# Patient Record
Sex: Male | Born: 1945 | Race: White | Hispanic: No | Marital: Married | Smoking: Former smoker
Health system: Southern US, Community
[De-identification: ages and names within clinical notes are randomized; demographics above are authoritative.]

## PROBLEM LIST (undated history)

## (undated) DIAGNOSIS — L0390 Cellulitis, unspecified: Secondary | ICD-10-CM

## (undated) DIAGNOSIS — N189 Chronic kidney disease, unspecified: Secondary | ICD-10-CM

## (undated) DIAGNOSIS — M519 Unspecified thoracic, thoracolumbar and lumbosacral intervertebral disc disorder: Secondary | ICD-10-CM

## (undated) DIAGNOSIS — R0902 Hypoxemia: Secondary | ICD-10-CM

## (undated) DIAGNOSIS — R42 Dizziness and giddiness: Secondary | ICD-10-CM

## (undated) DIAGNOSIS — G909 Disorder of the autonomic nervous system, unspecified: Secondary | ICD-10-CM

## (undated) DIAGNOSIS — J189 Pneumonia, unspecified organism: Secondary | ICD-10-CM

## (undated) DIAGNOSIS — R809 Proteinuria, unspecified: Secondary | ICD-10-CM

## (undated) DIAGNOSIS — J8489 Other specified interstitial pulmonary diseases: Secondary | ICD-10-CM

## (undated) DIAGNOSIS — E785 Hyperlipidemia, unspecified: Secondary | ICD-10-CM

## (undated) DIAGNOSIS — L0291 Cutaneous abscess, unspecified: Secondary | ICD-10-CM

## (undated) DIAGNOSIS — J4489 Other specified chronic obstructive pulmonary disease: Secondary | ICD-10-CM

## (undated) DIAGNOSIS — R5381 Other malaise: Secondary | ICD-10-CM

## (undated) DIAGNOSIS — E1340 Other specified diabetes mellitus with diabetic neuropathy, unspecified: Secondary | ICD-10-CM

## (undated) DIAGNOSIS — M25659 Stiffness of unspecified hip, not elsewhere classified: Secondary | ICD-10-CM

## (undated) DIAGNOSIS — K5900 Constipation, unspecified: Secondary | ICD-10-CM

## (undated) DIAGNOSIS — I10 Essential (primary) hypertension: Secondary | ICD-10-CM

## (undated) DIAGNOSIS — J449 Chronic obstructive pulmonary disease, unspecified: Secondary | ICD-10-CM

## (undated) DIAGNOSIS — J439 Emphysema, unspecified: Secondary | ICD-10-CM

## (undated) DIAGNOSIS — I509 Heart failure, unspecified: Secondary | ICD-10-CM

## (undated) DIAGNOSIS — I499 Cardiac arrhythmia, unspecified: Secondary | ICD-10-CM

## (undated) DIAGNOSIS — M25529 Pain in unspecified elbow: Secondary | ICD-10-CM

## (undated) DIAGNOSIS — L84 Corns and callosities: Secondary | ICD-10-CM

## (undated) DIAGNOSIS — M720 Palmar fascial fibromatosis [Dupuytren]: Secondary | ICD-10-CM

## (undated) DIAGNOSIS — R55 Syncope and collapse: Secondary | ICD-10-CM

## (undated) DIAGNOSIS — R109 Unspecified abdominal pain: Secondary | ICD-10-CM

## (undated) DIAGNOSIS — R071 Chest pain on breathing: Secondary | ICD-10-CM

## (undated) DIAGNOSIS — R630 Anorexia: Secondary | ICD-10-CM

## (undated) DIAGNOSIS — E1169 Type 2 diabetes mellitus with other specified complication: Secondary | ICD-10-CM

## (undated) DIAGNOSIS — E1142 Type 2 diabetes mellitus with diabetic polyneuropathy: Secondary | ICD-10-CM

## (undated) DIAGNOSIS — J90 Pleural effusion, not elsewhere classified: Secondary | ICD-10-CM

## (undated) DIAGNOSIS — R05 Cough: Secondary | ICD-10-CM

## (undated) DIAGNOSIS — M79609 Pain in unspecified limb: Secondary | ICD-10-CM

## (undated) DIAGNOSIS — I739 Peripheral vascular disease, unspecified: Secondary | ICD-10-CM

## (undated) DIAGNOSIS — R059 Cough, unspecified: Secondary | ICD-10-CM

## (undated) DIAGNOSIS — G4733 Obstructive sleep apnea (adult) (pediatric): Secondary | ICD-10-CM

## (undated) DIAGNOSIS — R5383 Other fatigue: Secondary | ICD-10-CM

## (undated) DIAGNOSIS — E079 Disorder of thyroid, unspecified: Secondary | ICD-10-CM

## (undated) DIAGNOSIS — R222 Localized swelling, mass and lump, trunk: Secondary | ICD-10-CM

## (undated) DIAGNOSIS — J019 Acute sinusitis, unspecified: Secondary | ICD-10-CM

## (undated) DIAGNOSIS — I2510 Atherosclerotic heart disease of native coronary artery without angina pectoris: Secondary | ICD-10-CM

## (undated) DIAGNOSIS — R12 Heartburn: Secondary | ICD-10-CM

## (undated) DIAGNOSIS — M109 Gout, unspecified: Secondary | ICD-10-CM

## (undated) DIAGNOSIS — I639 Cerebral infarction, unspecified: Secondary | ICD-10-CM

## (undated) DIAGNOSIS — R439 Unspecified disturbances of smell and taste: Secondary | ICD-10-CM

## (undated) HISTORY — DX: Cough: R05

## (undated) HISTORY — PX: CARDIAC CATHETERIZATION: SHX172

## (undated) HISTORY — DX: Acute sinusitis, unspecified: J01.90

## (undated) HISTORY — DX: Other specified chronic obstructive pulmonary disease: J44.89

## (undated) HISTORY — PX: TESTICLE SURGERY: SHX794

## (undated) HISTORY — DX: Cough, unspecified: R05.9

## (undated) HISTORY — DX: Other malaise: R53.81

## (undated) HISTORY — DX: Other specified interstitial pulmonary diseases: J84.89

## (undated) HISTORY — PX: LUNG REMOVAL, PARTIAL: SHX233

## (undated) HISTORY — DX: Cutaneous abscess, unspecified: L02.91

## (undated) HISTORY — DX: Pleural effusion, not elsewhere classified: J90

## (undated) HISTORY — DX: Constipation, unspecified: K59.00

## (undated) HISTORY — DX: Other specified diabetes mellitus with diabetic neuropathy, unspecified: E13.40

## (undated) HISTORY — DX: Pain in unspecified limb: M79.609

## (undated) HISTORY — DX: Other fatigue: R53.83

## (undated) HISTORY — DX: Essential (primary) hypertension: I10

## (undated) HISTORY — DX: Peripheral vascular disease, unspecified: I73.9

## (undated) HISTORY — DX: Obstructive sleep apnea (adult) (pediatric): G47.33

## (undated) HISTORY — DX: Palmar fascial fibromatosis (dupuytren): M72.0

## (undated) HISTORY — DX: Type 2 diabetes mellitus with other specified complication: E11.69

## (undated) HISTORY — DX: Chronic obstructive pulmonary disease, unspecified: J44.9

## (undated) HISTORY — DX: Pneumonia, unspecified organism: J18.9

## (undated) HISTORY — DX: Corns and callosities: L84

## (undated) HISTORY — DX: Unspecified disturbances of smell and taste: R43.9

## (undated) HISTORY — DX: Dizziness and giddiness: R42

## (undated) HISTORY — DX: Atherosclerotic heart disease of native coronary artery without angina pectoris: I25.10

## (undated) HISTORY — DX: Anorexia: R63.0

## (undated) HISTORY — DX: Disorder of the autonomic nervous system, unspecified: G90.9

## (undated) HISTORY — DX: Gout, unspecified: M10.9

## (undated) HISTORY — DX: Localized swelling, mass and lump, trunk: R22.2

## (undated) HISTORY — DX: Proteinuria, unspecified: R80.9

## (undated) HISTORY — DX: Stiffness of unspecified hip, not elsewhere classified: M25.659

## (undated) HISTORY — DX: Unspecified abdominal pain: R10.9

## (undated) HISTORY — DX: Pain in unspecified elbow: M25.529

## (undated) HISTORY — DX: Emphysema, unspecified: J43.9

## (undated) HISTORY — DX: Unspecified thoracic, thoracolumbar and lumbosacral intervertebral disc disorder: M51.9

## (undated) HISTORY — DX: Type 2 diabetes mellitus with diabetic polyneuropathy: E11.42

## (undated) HISTORY — DX: Heartburn: R12

## (undated) HISTORY — DX: Chest pain on breathing: R07.1

## (undated) HISTORY — DX: Cellulitis, unspecified: L03.90

## (undated) HISTORY — DX: Hypoxemia: R09.02

## (undated) HISTORY — DX: Hyperlipidemia, unspecified: E78.5

---

## 1999-12-14 HISTORY — PX: LUMBAR DISC SURGERY: SHX700

## 2002-06-29 ENCOUNTER — Inpatient Hospital Stay (HOSPITAL_COMMUNITY): Admission: EM | Admit: 2002-06-29 | Discharge: 2002-07-04 | Payer: Self-pay | Admitting: Emergency Medicine

## 2002-06-29 ENCOUNTER — Encounter: Payer: Self-pay | Admitting: Emergency Medicine

## 2002-07-01 ENCOUNTER — Encounter: Payer: Self-pay | Admitting: Internal Medicine

## 2003-10-27 ENCOUNTER — Inpatient Hospital Stay (HOSPITAL_COMMUNITY): Admission: EM | Admit: 2003-10-27 | Discharge: 2003-10-30 | Payer: Self-pay | Admitting: Emergency Medicine

## 2008-12-13 HISTORY — PX: ARM DEBRIDEMENT: SHX890

## 2010-12-13 HISTORY — PX: VASCULAR SURGERY: SHX849

## 2011-08-31 ENCOUNTER — Ambulatory Visit (INDEPENDENT_AMBULATORY_CARE_PROVIDER_SITE_OTHER): Payer: 59 | Admitting: Thoracic Surgery (Cardiothoracic Vascular Surgery)

## 2011-08-31 ENCOUNTER — Encounter: Payer: Self-pay | Admitting: Thoracic Surgery (Cardiothoracic Vascular Surgery)

## 2011-08-31 DIAGNOSIS — R911 Solitary pulmonary nodule: Secondary | ICD-10-CM

## 2011-08-31 DIAGNOSIS — J984 Other disorders of lung: Secondary | ICD-10-CM

## 2011-08-31 DIAGNOSIS — J449 Chronic obstructive pulmonary disease, unspecified: Secondary | ICD-10-CM

## 2011-08-31 NOTE — Progress Notes (Signed)
PCP is No primary provider on file. Referring Provider is Kenney Houseman, MD  Chief Complaint  Patient presents with  . Lung Lesion    HPI: 65 yo WM with COPD. Diagnosed with pneumonia of left lower lobe in July of 2012 after presenting with hemoptysis, treated with antibiotics. His symptoms resolved, but the consolidation never completely resolved on Xray.  He underwent bronchoscopy on 08/04/11. Path showed chronic inflammation and some atypical cells suspicious for adenocarcinoma, but not diagnostic. He then had a CT guided needle biopsy which showed inflammation, no malignancy seen. He then had a PET which showed increased metabolic uptake in the lesion.  He has severe COPD by PFTs which were done a few weeks later, but does say his breathing has improved since that time. He has not had any further hemoptysis. Able to work in yard and on an old car. Completed 6 minute walk test without dyspnea or significant desat.   Past Medical History  Diagnosis Date  . Abdominal pain, unspecified site   . Acute gouty arthropathy   . Acute sinusitis, unspecified   . Painful respiration   . Anorexia   . Essential hypertension, benign     Patient is currently not taking his blood pressure medications as prescribed.  . Corns and callosities   . Cellulitis and abscess of unspecified site   . Chronic airway obstruction, not elsewhere classified     2x30 quit 90 ? restriction/2x25 quit 90  . Pneumonia, organism unspecified     left lower lobe  . Unspecified constipation   . Coronary atherosclerosis of unspecified type of vessel, native or graft   . Cough   . Hemoptysis   . Peripheral autonomic neuropathy in disorders classified elsewhere   . Polyneuropathy in diabetes   . Disturbances of sensation of smell and taste   . Dizziness and giddiness   . Contracture of palmar fascia   . Unspecified essential hypertension     Hypertension Inadequately controlled  . Other malaise and fatigue   . Pain in  limb     foot pain(soft tissue)  . Heartburn   . Hyperlipidemia   . Hypoxemia   . Pain in joint, upper arm     localized in the elbow  . Stiffness of joint, not elsewhere classified, pelvic region and thigh     hip  . Unspecified pleural effusion     left-sided  . Other and unspecified disc disorder of lumbar region   . Swelling, mass, or lump in chest     LLL superior segment peripheral ?cavitations, pl changes  . Proteinuria   . Obstructive sleep apnea of adult   . Neuropathy due to secondary diabetes   . PAD (peripheral artery disease)   . Diabetes mellitus   . Diabetes mellitus associated with receptor abnormality     No past surgical history on file.  Family History  Problem Relation Age of Onset  . Diabetes Mother   . Hypertension Mother   . COPD Father     Social History History  Substance Use Topics  . Smoking status: Former Smoker -- 0.0 packs/day    Quit date: 12/13/1988  . Smokeless tobacco: Not on file  . Alcohol Use:     Current Outpatient Prescriptions  Medication Sig Dispense Refill  . albuterol (PROVENTIL) (2.5 MG/3ML) 0.083% nebulizer solution Take 2.5 mg by nebulization. Use 1 Unit Dose VIA Nebulizer 4 times a Day as Needed       . AMITRIPTYLINE HCL  PO 50 mg, Take 3 tablets bedtime       . budesonide-formoterol (SYMBICORT) 160-4.5 MCG/ACT inhaler Inhale 2 puffs into the lungs 2 (two) times daily.        Marland Kitchen gabapentin (NEURONTIN) 600 MG tablet Take 1-2 Tab PO BID       . glucose blood (ACCU-CHEK ADVANTAGE TEST) test strip Check BS prior to each meal and bedtime       . guaiFENesin-codeine (CHERATUSSIN AC) 100-10 MG/5ML syrup Take 1 Teaspoonful every 4-6 hours as needed       . hydrochlorothiazide (HYDRODIURIL) 25 MG tablet Take 25 mg by mouth daily.        . Insulin Aspart (NOVOLOG [place]) Inject into the skin. Use as directed       . insulin glargine (LANTUS) 100 UNIT/ML injection Inject 40 Units at bedtime       . Insulin Syringe-Needle U-100 (B-D INS  SYRINGE 0.5CC/31GX5/16) 31G X 5/16" 0.5 ML MISC Use 4 shots per day       . losartan (COZAAR) 100 MG tablet Take 100 mg by mouth daily.        Marland Kitchen losartan-hydrochlorothiazide (HYZAAR) 100-25 MG per tablet       . pravastatin (PRAVACHOL) 80 MG tablet Take 80 mg by mouth at bedtime.          Allergies  Allergen Reactions  . Prednisone Other (See Comments)    hyperglycemia    Review of Systems: Denies F, C, S, No CP. + SOB with exertion, occasional; wheezing, o2 QHS for OSA, finger contractures, DM well controlled on lantus + novolog SSI, neuropathy, back pain, cough. All other systems -  There were no vitals taken for this visit. Physical Exam: 65 yo WM in NAD, obese Neuro: A & o x 3, no focal motor deficits HEENT: glasses Neck: No bruits or adenopathy Chest: Diminished BS bilat, + EE wheezing bilat Cardiac: RRR, no murmur Abd: obese, soft nontender Ext: contractures 4th and 5th digits both hands, no edema, mild clubbing  Diagnostic Tests: PET reviewed. Consolidated superior segment LLL, SUV 2.8, central focus 1.5 cm, SUV 4.6. Uptake in both parotid glands.  PFTS:FEV1 1.03(30%), FVC 1.88(44%), DLCO 48% pred(2008)  Impression: 65 yo Wm with history of heavy tobacco abuse and severe COPD recently had severe pneumonia of superior segment of LLL. Consolidation has not completely resolved. It is hypermetabolic by PET. The question of an underlying lung cancer has been raised and some atypical cells were seen on bronchocopic biopsy. This could simply be BOOP or there could be an underlying cancer. The only way to know for sure is to do an excisional biopsy, which in all likelihood in his case would require a superior segmentectomy. There is not a discrete nodule amenable to wedge resection. He is a marginal candidate by PFTs, but his exercise tolerance and 6 min walk test indicate he can probably tolerate a segmental resection. I don't believe he's a candidate for a lobectomy. The other  options are to repeat bronch or to follow the area with serial CTs. He is not interested in either of those approaches and strongly desires surgical resection.  I discussed with the patient and his wife the indications, risks, benefits and alternatives. They understand the operation would be a Left VATS, possible thoracotomy, superior segmentectomy. They understand he is high risk. They understand the risks include but are not limited to death, MI, DVT, PE, bleeding, possible need for transfusion, infection, respiratory failure, other organ system dysfunction, prolonged  air leak.  Plan: He wishes to proceed  With L VATS, wedge resection vs. LLL superior segmentectomy for definitive diagnosis.   Needs preoperative cardiology consult for clearance given multiple risk factors and DM with limited physical activity. Will arrange appointment  Follow up with Dr. Laneta Simmers after seen by Cardiology.

## 2011-09-13 ENCOUNTER — Encounter: Payer: Self-pay | Admitting: Surgery

## 2011-09-13 ENCOUNTER — Ambulatory Visit (INDEPENDENT_AMBULATORY_CARE_PROVIDER_SITE_OTHER): Payer: 59 | Admitting: Surgery

## 2011-09-13 DIAGNOSIS — R911 Solitary pulmonary nodule: Secondary | ICD-10-CM | POA: Insufficient documentation

## 2011-09-13 DIAGNOSIS — J984 Other disorders of lung: Secondary | ICD-10-CM

## 2011-09-13 NOTE — Progress Notes (Signed)
HPI:  Anthony Petersen returns today following preoperative cardiology evaluation for possible lung resection. Dr. Dot Been did not feel that further cardiology evaluation was needed. He thought that his overall cardiac status was stable and he was an acceptable risk for the planned procedure. The patient said that his breathing has been stable. He continues to use oxygen by nasal cannula at night and is currently on about 3 L per minute. He says that he felt fairly good early in the summer before his shortness of breath and hemoptysis started. He was able to get out in the yard and cut his grass. His breathing has improved with treatment of possible pneumonia but he is not back to baseline.  Current Outpatient Prescriptions  Medication Sig Dispense Refill  . albuterol (PROVENTIL) (2.5 MG/3ML) 0.083% nebulizer solution Take 2.5 mg by nebulization. Use 1 Unit Dose VIA Nebulizer 4 times a Day as Needed       . AMITRIPTYLINE HCL PO 50 mg, Take 3 tablets bedtime       . budesonide-formoterol (SYMBICORT) 160-4.5 MCG/ACT inhaler Inhale 2 puffs into the lungs 2 (two) times daily.        Marland Kitchen gabapentin (NEURONTIN) 600 MG tablet Take 1-2 Tab PO BID       . glucose blood (ACCU-CHEK ADVANTAGE TEST) test strip Check BS prior to each meal and bedtime       . guaiFENesin-codeine (CHERATUSSIN AC) 100-10 MG/5ML syrup Take 1 Teaspoonful every 4-6 hours as needed       . hydrochlorothiazide (HYDRODIURIL) 25 MG tablet Take 25 mg by mouth daily.        . Insulin Aspart (NOVOLOG [place]) Inject into the skin. Use as directed       . insulin glargine (LANTUS) 100 UNIT/ML injection Inject 40 Units at bedtime       . Insulin Syringe-Needle U-100 (B-D INS SYRINGE 0.5CC/31GX5/16) 31G X 5/16" 0.5 ML MISC Use 4 shots per day       . losartan (COZAAR) 100 MG tablet Take 100 mg by mouth daily.        Marland Kitchen losartan-hydrochlorothiazide (HYZAAR) 100-25 MG per tablet       . pravastatin (PRAVACHOL) 80 MG tablet Take 80 mg by mouth at bedtime.            Physical Exam:  He looks well. Lung exam is clear. Cardiac exam shows a regular rate and rhythm with normal heart sounds. There is no murmur, rub, or gallop. There is no cervical or supraclavicular adenopathy. Abdominal exam shows active bowel sounds. There are no palpable masses or organomegaly.    Impression:  Anthony Petersen has an ill-defined, infiltrative process involving the left lower lobe of the lung. When he initially presented this appeared to be more of a well-defined round mass. Since his initial presentation the more well-defined round mass has resolved but there is still this ill-defined process involving the superior segment of the left lower lobe. This does extend down along the left lower lobe bronchus and could extend into the basilar segments. His initial transbronchial lung biopsy from 07/27/2011 showed rare atypical cells suspicious for carcinoma in the brushings and biopsies from the left lower lobe. It was noted by the pathologist that this was suspicious for adenocarcinoma although they could not completely exclude reactive pulmonary cells secondary to the tremendous inflammation and fibrosis present. A subsequent needle biopsy of this area showed organizing pneumonia with interstitial chronic inflammation and no evidence of malignancy. His PET scan from 08/20/2011  did show consolidation of the superior segment of left lower lobe posterior medially against the pleura. Most of this consolidated lung has increased low level uptake with a maximum SUV of 2.8. There is a focus within this measuring 1.5 cm that had a maximum SUV of 4.6. He also had mildly prominent uptake in both parotid glands that was nonspecific. Despite all this workup it is still not clear whether this process in the left lower lobe of the lung is all inflammatory or whether there is a neoplasm located within it. Dr. Dorris Fetch and I both discussed the options with the patient and his wife including  continuing to follow this with periodic CT scans and surgical resection of the lesion to allow a more precise pathologic diagnosis. I don't think further biopsies by bronchoscopy will give [place] any better information. This is a complicated decision because he has severe COPD with an FEV1 of 1.0 and a severe reduction in his diffusion capacity. I think he would probably tolerate a superior segmentectomy but a left lower lobectomy may leave him with significant pulmonary disability. Unfortunately if there is cancer present we may not be able to completely remove it without performing a lobectomy since this lesion does appear to extend down along the left lower lobe bronchus and may involve the basilar segments. I would plan to perform a superior segmentectomy initially for pathologic examination and assessment of the frozen section margin. If there is cancer present and it involves the surgical margin then it would probably be best to perform a completion left lower lobectomy to remove all the cancer. If we only remove the superior segment and leave cancer behind in the basilar segments I don't think the patient will be any better off than he is now. With his severe COPD he would probably not be a candidate for radiation therapy postoperatively. I discussed all this with the patient and his wife. I discussed the alternatives, benefits, and risks including but not limited to bleeding, blood transfusion, infection, prolonged air leak from the lung, bronchopleural fistula, prolonged mechanical ventilation, and ongoing pulmonary disability. I also discussed the risk of death due to complications from surgery. He understands all this and would like to proceed with surgery.  Plan:  We will schedule him for bronchoscopy, left VATS, and possible thoracotomy for resection of the left lower lobe lesion on Wednesday, 09/22/2011.

## 2011-09-22 DIAGNOSIS — D381 Neoplasm of uncertain behavior of trachea, bronchus and lung: Secondary | ICD-10-CM

## 2011-09-23 DIAGNOSIS — E1165 Type 2 diabetes mellitus with hyperglycemia: Secondary | ICD-10-CM

## 2011-10-27 ENCOUNTER — Ambulatory Visit (INDEPENDENT_AMBULATORY_CARE_PROVIDER_SITE_OTHER): Payer: 59 | Admitting: Physician Assistant

## 2011-10-27 DIAGNOSIS — Z9889 Other specified postprocedural states: Secondary | ICD-10-CM

## 2011-10-27 DIAGNOSIS — D381 Neoplasm of uncertain behavior of trachea, bronchus and lung: Secondary | ICD-10-CM

## 2011-10-27 NOTE — Progress Notes (Signed)
Mr. Anthony Petersen return to the office today for followup after left video-assisted thoracoscopy followed by left thoracotomy with left lower lobectomy by Dr. Lavinia Sharps on 09/22/2011. Mr. Anthony Petersen presented with finding of a consolidated mass in the superior segment of the left upper lobe that was suspicious for malignancy. Please see Dr. Edwin Cap consultation note for details. Left lower lobe lobectomy was carried out without complication. Mr. Anthony Petersen had an uncomplicated postoperative course and since his discharge home he is continuing to make satisfactory progress. He continues to have some shortness of breath which he relates to his long-standing history of severe COPD. He was seen by his infectious disease doctor yesterday in according to Mr. Anthony Petersen the antibiotics were discontinued at that visit. Mr. Anthony Petersen and his wife also related to me that they had been referred to a pulmonologist at [facility] for additional evaluation.  On exam the heart is in a regular rate and rhythm. Heart rate is 60 blood pressure 148/98. Breath sounds are clear to auscultation. Chest x-ray obtained today shows stable chronic changes in the left base. The right lung remains clear. Sternotomy incision is well healed.  Medications are reviewed. Mr. Anthony Petersen had not been taking his pravastatin since his hospitalization/than to resume at aged. His antihypertensive medicine was also apparently change during hospitalization to lisinopril and chlorthalidone which is resulting in better control according to the patient. No medication changes are made except for the advised to resume the pravastatin.

## 2011-12-17 LAB — PULMONARY FUNCTION TEST

## 2012-01-31 ENCOUNTER — Encounter (HOSPITAL_COMMUNITY)
Admission: RE | Admit: 2012-01-31 | Discharge: 2012-01-31 | Disposition: A | Discharge status: Home / Self Care | Payer: 59 | Source: Ambulatory Visit | Attending: Internal Medicine | Admitting: Internal Medicine

## 2012-01-31 ENCOUNTER — Encounter (HOSPITAL_COMMUNITY): Payer: Self-pay

## 2012-01-31 DIAGNOSIS — J449 Chronic obstructive pulmonary disease, unspecified: Secondary | ICD-10-CM | POA: Insufficient documentation

## 2012-01-31 DIAGNOSIS — Z5189 Encounter for other specified aftercare: Secondary | ICD-10-CM | POA: Insufficient documentation

## 2012-01-31 DIAGNOSIS — J4489 Other specified chronic obstructive pulmonary disease: Secondary | ICD-10-CM | POA: Insufficient documentation

## 2012-01-31 HISTORY — DX: Cardiac arrhythmia, unspecified: I49.9

## 2012-01-31 HISTORY — DX: Chronic kidney disease, unspecified: N18.9

## 2012-01-31 NOTE — Progress Notes (Signed)
Pt here for orientation to Pulmonary Rehab.  Walk test done, patient states he has had circulation issues in his legs with stenting in R leg.  Distal pulses  1/2 bilateral.  Feet warm.  Color good.  We will observe for any increased discomfort or changes as he exercises. Demonstration and practice of PLB using pulse oximeter.  Patient able to return demonstration satisfactorily. Safety and hand hygiene in the exercise area reviewed with patient.  Patient voices understanding.  Breath sounds diminished L base.  He will begin exercise on 02-01-2012

## 2012-02-01 ENCOUNTER — Encounter (HOSPITAL_COMMUNITY): Payer: 59

## 2012-02-01 ENCOUNTER — Encounter (HOSPITAL_COMMUNITY)
Admission: RE | Admit: 2012-02-01 | Discharge: 2012-02-01 | Disposition: A | Discharge status: Home / Self Care | Payer: 59 | Source: Ambulatory Visit | Attending: Internal Medicine | Admitting: Internal Medicine

## 2012-02-01 LAB — GLUCOSE, CAPILLARY
Glucose-Capillary: 72 mg/dL (ref 70–99)
Glucose-Capillary: 101 mg/dL — ABNORMAL HIGH (ref 70–99)

## 2012-02-01 NOTE — Progress Notes (Signed)
First day of exercise in Pulmonary Rehab.  Patient oriented to equipment use and safety, RPE and Dyspnea Scale, Rest breaks. Demonstration and practice of PLB  On each exercise station.  Tolerated well, vital signs stable. O2 satuartions were stable in 90's.  Tolerated well

## 2012-02-03 ENCOUNTER — Encounter (HOSPITAL_COMMUNITY): Payer: 59

## 2012-02-03 ENCOUNTER — Encounter (HOSPITAL_COMMUNITY)
Admission: RE | Admit: 2012-02-03 | Discharge: 2012-02-03 | Disposition: A | Discharge status: Home / Self Care | Payer: 59 | Source: Ambulatory Visit | Attending: Internal Medicine | Admitting: Internal Medicine

## 2012-02-08 ENCOUNTER — Encounter (HOSPITAL_COMMUNITY): Payer: 59

## 2012-02-08 ENCOUNTER — Encounter (HOSPITAL_COMMUNITY)
Admission: RE | Admit: 2012-02-08 | Discharge: 2012-02-08 | Disposition: A | Discharge status: Home / Self Care | Payer: 59 | Source: Ambulatory Visit | Attending: Internal Medicine | Admitting: Internal Medicine

## 2012-02-10 ENCOUNTER — Encounter (HOSPITAL_COMMUNITY)
Admission: RE | Admit: 2012-02-10 | Discharge: 2012-02-10 | Disposition: A | Discharge status: Home / Self Care | Payer: 59 | Source: Ambulatory Visit | Attending: Internal Medicine | Admitting: Internal Medicine

## 2012-02-10 ENCOUNTER — Encounter (HOSPITAL_COMMUNITY): Payer: 59

## 2012-02-10 NOTE — Progress Notes (Signed)
Pulmonary Rehab Nutrition Screen  Anthony Petersen 66 y.o. male             Ht: 71" Ht Readings from Last 1 Encounters:  No data found for Ht    Wt:   220.7 lb (100.3 kg) Wt Readings from Last 3 Encounters:  No data found for Wt    BMI: 30.8  27.7%body fat                       Rate Your Plate Score: 36  Please answer the following questions:             YES  NO    Do you live in a nursing home?  X   Do you eat out more than 3 times per week?    X If yes, how many times per week do you eat out?   Do you have food allergies?   X If yes, what are you allergic to?  Have you gained or lost more than 10 lbs without trying?              X  If yes, how much weight have you  lost or gained? 15 lbs over 3 mo   Do you want to lose weight?    X X If yes, what is a goal weight or amount of weight you would like to lose? 20 lbs  Do you eat alone most of the time?  X    Do you eat less than 2 meals/day?  X If yes, how many meals do you eat?  Do you use canned and convenience food? X    Do you use a salt shaker?  X   Do you drink more than 3 alcoholic drinks/day?  X If yes, how many drinks per day?  Are you having trouble with constipation? *  X If yes, what are you doing to help relieve constipation?  Do you have financial difficulties with buying food? *  X   Do you usually need help with grocery shopping or with cooking? *  X   Do you have a poor appetite? *                                       X   Do you have trouble chewing/ swallowing? *   X   Do you take vitamin and mineral or herbal supplements? * X  If yes, what kind of supplements do you currently take? MVI    Past Medical History  Diagnosis Date  . Abdominal pain, unspecified site   . Acute gouty arthropathy   . Acute sinusitis, unspecified   . Painful respiration   . Anorexia   . Essential hypertension, benign     Patient is currently not taking his blood pressure medications as prescribed.  . Corns and callosities   .  Cellulitis and abscess of unspecified site   . Chronic airway obstruction, not elsewhere classified     2x30 quit 90 ? restriction/2x25 quit 90  . Pneumonia, organism unspecified     left lower lobe  . Unspecified constipation   . Cough   . Hemoptysis   . Peripheral autonomic neuropathy in disorders classified elsewhere   . Polyneuropathy in diabetes   . Disturbances of sensation of smell and taste   . Dizziness and giddiness   . Contracture  of palmar fascia   . Unspecified essential hypertension     Hypertension Inadequately controlled  . Other malaise and fatigue   . Pain in limb     foot pain(soft tissue)  . Heartburn   . Hyperlipidemia   . Hypoxemia   . Pain in joint, upper arm     localized in the elbow  . Stiffness of joint, not elsewhere classified, pelvic region and thigh     hip  . Unspecified pleural effusion     left-sided  . Other and unspecified disc disorder of lumbar region   . Swelling, mass, or lump in chest     LLL superior segment peripheral ?cavitations, pl changes  . Proteinuria   . Obstructive sleep apnea of adult   . Neuropathy due to secondary diabetes   . PAD (peripheral artery disease)   . Diabetes mellitus   . Diabetes mellitus associated with receptor abnormality   . Arrhythmia     irregular heart beat  . Coronary atherosclerosis of unspecified type of vessel, native or graft   . Chronic kidney disease     early stages of renal failure  Meds: Hydrochlorothiazide, Novolog, Lantus, MVI Labs Lipids Lipid Panel  No results found for this basename: chol, trig, hdl, cholhdl, vldl, ldlcalc   No results found for this basename: HGBA1C   Nutrition Note Spoke with pt briefly.  Pt reports his CBG's falling 70 points with exercise.  Pt states he ate a bowl of Cheerios and 1/2 banana before exercise today.  Healthier pre-exercise breakfast choices discussed (e.g. Peanut butter on whole wheat toast, changing to a higher fiber cereal (shredded wheat,  wheat chex, etc.), or making an Egg beater sandwich on a whole grain English muffin.  Pt states he has been to many DM education classes.  Per pt, "I know what I should do, I just don't do it." This Clinical research associate ? Pt re: barriers to changing pt diet to become healthier.  Pt unable to name one barrier he sees prohibiting him from changing his diet. Continue client-centered nutrition education by RD as part of interdisciplinary care.  Monitor and evaluate progress toward nutrition goal with team.   Estimated Nutrition Needs for wt loss: 1700-2200 kcal, 80-100 gm protein, and less than 1500 mg sodium daily.  Nutrition Risk Level: High

## 2012-02-15 ENCOUNTER — Encounter (HOSPITAL_COMMUNITY): Payer: 59

## 2012-02-15 ENCOUNTER — Encounter (HOSPITAL_COMMUNITY)
Admission: RE | Admit: 2012-02-15 | Discharge: 2012-02-15 | Disposition: A | Discharge status: Home / Self Care | Payer: 59 | Source: Ambulatory Visit | Attending: Internal Medicine | Admitting: Internal Medicine

## 2012-02-15 DIAGNOSIS — J449 Chronic obstructive pulmonary disease, unspecified: Secondary | ICD-10-CM | POA: Insufficient documentation

## 2012-02-15 DIAGNOSIS — Z5189 Encounter for other specified aftercare: Secondary | ICD-10-CM | POA: Insufficient documentation

## 2012-02-15 DIAGNOSIS — J4489 Other specified chronic obstructive pulmonary disease: Secondary | ICD-10-CM | POA: Insufficient documentation

## 2012-02-17 ENCOUNTER — Encounter (HOSPITAL_COMMUNITY)
Admission: RE | Admit: 2012-02-17 | Discharge: 2012-02-17 | Disposition: A | Discharge status: Home / Self Care | Payer: 59 | Source: Ambulatory Visit | Attending: Internal Medicine | Admitting: Internal Medicine

## 2012-02-17 ENCOUNTER — Encounter (HOSPITAL_COMMUNITY): Payer: 59

## 2012-02-22 ENCOUNTER — Encounter (HOSPITAL_COMMUNITY)
Admission: RE | Admit: 2012-02-22 | Discharge: 2012-02-22 | Disposition: A | Discharge status: Home / Self Care | Payer: 59 | Source: Ambulatory Visit | Attending: Internal Medicine | Admitting: Internal Medicine

## 2012-02-22 ENCOUNTER — Encounter (HOSPITAL_COMMUNITY): Payer: 59

## 2012-02-22 NOTE — Progress Notes (Signed)
Anthony Petersen 66 y.o. male Nutrition Note Spoke with pt. Pt is obese.  Pt eats 3 meals a day; most prepared at home.  There are many ways the pt can make his eating habits healthier.  Pt's Rate Your Plate results reviewed with pt.  Pt expressed understanding.  Pt does not avoid salty food; uses canned/ convenience food.  Pt does not add salt or salt-containing seasonings to food.  The role of sodium in lung disease reviewed with pt.  Pt is diabetic.  Will follow-up with pt re: diabetes management. Nutrition Diagnosis   Excessive sodium intake related to over consumption of processed food as evidenced by frequent consumption of convenience food/ canned vegetables.   Food-and nutrition-related knowledge deficit related to lack of exposure to information as related to diagnosis of pulmonary disease   Obesity related to excessive energy intake as evidenced by a BMI of 30.8 Nutrition Rx/Est. Daily Nutrition Needs for: ? wt loss 1700-2200 Kcal  80-100 gm protein   1500 mg or less sodium     250 gm CHO Nutrition Intervention   Benefits of adopting healthy eating habits discussed when pt's Rate Your Plate reviewed.   Pt to attend the Nutrition and Lung Disease class   Continual client-centered nutrition education by RD, as part of interdisciplinary care. Goal(s) 1. Pt to identify and limit food sources of sodium. 2. Identify food quantities necessary to achieve wt loss of  -2# per week to a goal wt of 89.1-97.3 kg (196-214 lb) at graduation from pulmonary rehab. 3. Describe the benefit of including fruits, vegetables, whole grains, and low-fat dairy products in a healthy meal plan. 4. Use pre-/post meal and/or exercise CBG's and A1c to determine whether adjustments in food/meal planning will be beneficial or if any meds need to be combined with nutrition therapy. Monitor and Evaluate progress toward nutrition goal with team.

## 2012-02-22 NOTE — Progress Notes (Signed)
Anthony Petersen has recently had some difficulty with drop in CBG after exercise.  Today entry CBG was 204,  120 after 2 stations of exercise at which time he was given a snack .  After completion of exercise and cool down his CBG was 73.  He was given juice and  After 15 minutes he was back to 109.  We reviewed his morning meal and he will try to include more protein on days of exercise.

## 2012-02-24 ENCOUNTER — Encounter (HOSPITAL_COMMUNITY)
Admission: RE | Admit: 2012-02-24 | Discharge: 2012-02-24 | Disposition: A | Discharge status: Home / Self Care | Payer: 59 | Source: Ambulatory Visit | Attending: Internal Medicine | Admitting: Internal Medicine

## 2012-02-24 ENCOUNTER — Encounter (HOSPITAL_COMMUNITY): Payer: 59

## 2012-02-24 NOTE — Progress Notes (Signed)
Anthony Petersen 66 y.o. male Nutrition Note Pt is diabetic.  Pt checks CBG's before meals. Pt states his last A1c was "in the low 7's." Pt unable to recall recommended CBG ranges before meals and 1-2 hours after meals.  Recommended CBG ranges reviewed with pt.  Pt able to recall hypoglycemia and hyperglycemia treatment protocols.  Treatment of hypoglycemia and hyperglycemia reviewed with pt.  Pt expressed understanding.  Pt reports he has been previously educated re: diabetic diet. Per discussion with pt, pt is proactive re: DM management.  Pt controls CBG's with "insulin." Pt asked several questions re: reasoning for dietary changes recommended during previous consultation.  Reasoning discussed.  Pt c/o not liking a lot of the healthier food choices.  This Clinical research associate encouraged pt to make dietary changes intentional and slow. Pt expressed understanding. Nutrition Diagnosis   Excessive sodium intake related to over consumption of processed food as evidenced by frequent consumption of convenience food/ canned vegetables.   Food-and nutrition-related knowledge deficit related to lack of exposure to information as related to diagnosis of pulmonary disease   Obesity related to excessive energy intake as evidenced by a BMI of 30.8 Nutrition Rx/Est. Daily Nutrition Needs for: ? wt loss 1700-2200 Kcal  80-100 gm protein   1500 mg or less sodium     250 gm CHO Nutrition Intervention   Pt's individual nutrition plan and goals reviewed with pt.   Pt to attend the Nutrition and Lung Disease class   Continual client-centered nutrition education by RD, as part of interdisciplinary care. Goal(s) 1. Pt to identify and limit food sources of sodium. 2. Identify food quantities necessary to achieve wt loss of  -2# per week to a goal wt of 89.1-97.3 kg (196-214 lb) at graduation from pulmonary rehab. 3. Describe the benefit of including fruits, vegetables, whole grains, and low-fat dairy products in a healthy meal  plan. 4. Use pre-/post meal and/or exercise CBG's and A1c to determine whether adjustments in food/meal planning will be beneficial or if any meds need to be combined with nutrition therapy. Monitor and Evaluate progress toward nutrition goal with team.   Nutrition Risk level: Moderate

## 2012-02-29 ENCOUNTER — Encounter (HOSPITAL_COMMUNITY): Payer: 59

## 2012-02-29 ENCOUNTER — Encounter (HOSPITAL_COMMUNITY)
Admission: RE | Admit: 2012-02-29 | Discharge: 2012-02-29 | Disposition: A | Discharge status: Home / Self Care | Payer: 59 | Source: Ambulatory Visit | Attending: Internal Medicine | Admitting: Internal Medicine

## 2012-03-02 ENCOUNTER — Encounter (HOSPITAL_COMMUNITY): Payer: 59

## 2012-03-02 ENCOUNTER — Encounter (HOSPITAL_COMMUNITY)
Admission: RE | Admit: 2012-03-02 | Discharge: 2012-03-02 | Disposition: A | Discharge status: Home / Self Care | Payer: 59 | Source: Ambulatory Visit | Attending: Internal Medicine | Admitting: Internal Medicine

## 2012-03-07 ENCOUNTER — Encounter (HOSPITAL_COMMUNITY): Payer: 59

## 2012-03-07 ENCOUNTER — Encounter (HOSPITAL_COMMUNITY)
Admission: RE | Admit: 2012-03-07 | Discharge: 2012-03-07 | Disposition: A | Discharge status: Home / Self Care | Payer: 59 | Source: Ambulatory Visit | Attending: Internal Medicine | Admitting: Internal Medicine

## 2012-03-09 ENCOUNTER — Encounter (HOSPITAL_COMMUNITY): Payer: 59

## 2012-03-09 ENCOUNTER — Encounter (HOSPITAL_COMMUNITY)
Admission: RE | Admit: 2012-03-09 | Discharge: 2012-03-09 | Disposition: A | Discharge status: Home / Self Care | Payer: 59 | Source: Ambulatory Visit | Attending: Internal Medicine | Admitting: Internal Medicine

## 2012-03-09 NOTE — Progress Notes (Signed)
Completed home exercise with patient. Reviewed exercise progression, routine, exercising at a comfortable pace, RPE/Dyspnea scales, how important it is to own a pulse oximeter and how to use one, weather conditions, warning signs and symptoms with exercise, and CP/NTG. We discussed when to call MD. Patient voices understanding. Patient has a goal to breathe better while doing ADLs and spend more quality time with grandbabies. Will continue to encourage and support.

## 2012-03-14 ENCOUNTER — Encounter (HOSPITAL_COMMUNITY): Payer: 59

## 2012-03-14 ENCOUNTER — Encounter (HOSPITAL_COMMUNITY)
Admission: RE | Admit: 2012-03-14 | Discharge: 2012-03-14 | Disposition: A | Discharge status: Home / Self Care | Payer: 59 | Source: Ambulatory Visit | Attending: Internal Medicine | Admitting: Internal Medicine

## 2012-03-14 DIAGNOSIS — J4489 Other specified chronic obstructive pulmonary disease: Secondary | ICD-10-CM | POA: Insufficient documentation

## 2012-03-14 DIAGNOSIS — J449 Chronic obstructive pulmonary disease, unspecified: Secondary | ICD-10-CM | POA: Insufficient documentation

## 2012-03-14 DIAGNOSIS — Z5189 Encounter for other specified aftercare: Secondary | ICD-10-CM | POA: Insufficient documentation

## 2012-03-16 ENCOUNTER — Encounter (HOSPITAL_COMMUNITY): Admission: RE | Admit: 2012-03-16 | Payer: 59 | Source: Ambulatory Visit

## 2012-03-16 ENCOUNTER — Encounter (HOSPITAL_COMMUNITY): Payer: 59

## 2012-03-21 ENCOUNTER — Encounter (HOSPITAL_COMMUNITY): Payer: 59

## 2012-03-21 ENCOUNTER — Encounter (HOSPITAL_COMMUNITY)
Admission: RE | Admit: 2012-03-21 | Discharge: 2012-03-21 | Disposition: A | Discharge status: Home / Self Care | Payer: 59 | Source: Ambulatory Visit | Attending: Internal Medicine | Admitting: Internal Medicine

## 2012-03-23 ENCOUNTER — Encounter (HOSPITAL_COMMUNITY)
Admission: RE | Admit: 2012-03-23 | Discharge: 2012-03-23 | Disposition: A | Discharge status: Home / Self Care | Payer: 59 | Source: Ambulatory Visit | Attending: Internal Medicine | Admitting: Internal Medicine

## 2012-03-23 ENCOUNTER — Encounter (HOSPITAL_COMMUNITY): Payer: 59

## 2012-03-28 ENCOUNTER — Encounter (HOSPITAL_COMMUNITY)
Admission: RE | Admit: 2012-03-28 | Discharge: 2012-03-28 | Disposition: A | Discharge status: Home / Self Care | Payer: 59 | Source: Ambulatory Visit | Attending: Internal Medicine | Admitting: Internal Medicine

## 2012-03-28 ENCOUNTER — Encounter (HOSPITAL_COMMUNITY): Payer: 59

## 2012-03-30 ENCOUNTER — Encounter (HOSPITAL_COMMUNITY)
Admission: RE | Admit: 2012-03-30 | Discharge: 2012-03-30 | Disposition: A | Discharge status: Home / Self Care | Payer: 59 | Source: Ambulatory Visit | Attending: Internal Medicine | Admitting: Internal Medicine

## 2012-03-30 ENCOUNTER — Encounter (HOSPITAL_COMMUNITY): Payer: 59

## 2012-04-04 ENCOUNTER — Encounter (HOSPITAL_COMMUNITY)
Admission: RE | Admit: 2012-04-04 | Discharge: 2012-04-04 | Disposition: A | Discharge status: Home / Self Care | Payer: 59 | Source: Ambulatory Visit | Attending: Internal Medicine | Admitting: Internal Medicine

## 2012-04-04 ENCOUNTER — Encounter (HOSPITAL_COMMUNITY): Payer: 59

## 2012-04-06 ENCOUNTER — Encounter (HOSPITAL_COMMUNITY)
Admission: RE | Admit: 2012-04-06 | Discharge: 2012-04-06 | Disposition: A | Discharge status: Home / Self Care | Payer: 59 | Source: Ambulatory Visit | Attending: Internal Medicine | Admitting: Internal Medicine

## 2012-04-06 ENCOUNTER — Encounter (HOSPITAL_COMMUNITY): Payer: 59

## 2012-04-11 ENCOUNTER — Encounter (HOSPITAL_COMMUNITY)
Admission: RE | Admit: 2012-04-11 | Discharge: 2012-04-11 | Disposition: A | Discharge status: Home / Self Care | Payer: 59 | Source: Ambulatory Visit | Attending: Internal Medicine | Admitting: Internal Medicine

## 2012-04-11 ENCOUNTER — Encounter (HOSPITAL_COMMUNITY): Payer: 59

## 2012-04-13 ENCOUNTER — Encounter (HOSPITAL_COMMUNITY): Payer: Medicare Other

## 2012-04-13 ENCOUNTER — Encounter (HOSPITAL_COMMUNITY)
Admission: RE | Admit: 2012-04-13 | Discharge: 2012-04-13 | Disposition: A | Discharge status: Home / Self Care | Payer: Medicare Other | Source: Ambulatory Visit | Attending: Internal Medicine | Admitting: Internal Medicine

## 2012-04-13 DIAGNOSIS — Z5189 Encounter for other specified aftercare: Secondary | ICD-10-CM | POA: Insufficient documentation

## 2012-04-13 DIAGNOSIS — J449 Chronic obstructive pulmonary disease, unspecified: Secondary | ICD-10-CM | POA: Insufficient documentation

## 2012-04-13 DIAGNOSIS — J4489 Other specified chronic obstructive pulmonary disease: Secondary | ICD-10-CM | POA: Insufficient documentation

## 2012-04-18 ENCOUNTER — Encounter (HOSPITAL_COMMUNITY): Payer: Medicare Other

## 2012-04-18 ENCOUNTER — Encounter (HOSPITAL_COMMUNITY)
Admission: RE | Admit: 2012-04-18 | Discharge: 2012-04-18 | Disposition: A | Discharge status: Home / Self Care | Payer: Medicare Other | Source: Ambulatory Visit | Attending: Internal Medicine | Admitting: Internal Medicine

## 2012-04-20 ENCOUNTER — Encounter (HOSPITAL_COMMUNITY): Payer: Medicare Other

## 2012-04-20 ENCOUNTER — Encounter (HOSPITAL_COMMUNITY)
Admission: RE | Admit: 2012-04-20 | Discharge: 2012-04-20 | Disposition: A | Discharge status: Home / Self Care | Payer: Medicare Other | Source: Ambulatory Visit | Attending: Internal Medicine | Admitting: Internal Medicine

## 2012-04-25 ENCOUNTER — Encounter (HOSPITAL_COMMUNITY)
Admission: RE | Admit: 2012-04-25 | Discharge: 2012-04-25 | Disposition: A | Discharge status: Home / Self Care | Payer: Medicare Other | Source: Ambulatory Visit | Attending: Internal Medicine | Admitting: Internal Medicine

## 2012-04-25 ENCOUNTER — Encounter (HOSPITAL_COMMUNITY): Payer: Medicare Other

## 2012-04-27 ENCOUNTER — Encounter (HOSPITAL_COMMUNITY): Payer: Medicare Other

## 2012-05-02 ENCOUNTER — Encounter (HOSPITAL_COMMUNITY): Payer: Medicare Other

## 2012-05-04 ENCOUNTER — Encounter (HOSPITAL_COMMUNITY): Payer: Medicare Other

## 2012-05-09 ENCOUNTER — Encounter (HOSPITAL_COMMUNITY): Payer: Medicare Other

## 2012-05-11 ENCOUNTER — Encounter (HOSPITAL_COMMUNITY): Payer: Medicare Other

## 2012-05-16 ENCOUNTER — Encounter (HOSPITAL_COMMUNITY): Payer: 59

## 2012-05-18 ENCOUNTER — Encounter (HOSPITAL_COMMUNITY): Payer: 59

## 2012-06-14 NOTE — Progress Notes (Signed)
Pulmonary Rehabilitation Program Outcomes Report   Orientation:  01/31/2012 Graduate Date:  04/25/2012 # of sessions completed: 24  Pulmonologist: Ramaswamy Class Time:  10:30  A.  Exercise Program:  Tolerates exercise @ 2.4 METS for 45 minutes, Walk Test Results:  Pre: 973ft and Post: 1042ft, Improved functional capacity  17.22 %, No Change  flexibility 0.00 %, Improved dyspnea score 7.04 %, No Change education score 0.00 %, Exercise limited by dyspnea, Discharged to home exercise program.  Anticipated compliance:  good and Discharged  B.  Mental Health:  Health related anxiety and Quality of Life (QOL)  changes:  Overall  24.26 %, Health/Functioning 91.91 %, Socioeconomics 30.21 %, Psych/Spiritual -6.64 %, Family 9.57 %    C.  Education/Instruction/Skills  Uses Perceived Exertion Scale and/or Dyspnea Scale and Attended 10 education classes  Demonstrates accurate diaphragmatic breathing and demonstrates pursed lip breathing.   D.  Nutrition/Weight Control/Body Composition:  % Body Fat  29.6, Patient has lost 0.3 kg and Pt is knowledgeable re: DM diet and monitors CBG's. Pt chooses not to follow a DM diet. Completed by Mickle Plumb, RD, LDN, CDE E.  Blood Lipids No results found for this basename: CHOL, HDL, LDLCALC, LDLDIRECT, TRIG, CHOLHDL    F.  Lifestyle Changes:  Making positive lifestyle changes  G.  Symptoms noted with exercise:  Questionable angina, Shortness of breath, Dizziness, Nausea and Fatigue  Report Completed By:  Frederik Schmidt. Manson Passey    Comments: Patient has graduated the program on a good note. Patient satified with the outcome of program as listed above. Patient tolerated well on exercises and was able to increase workloads as he could handle. Vitals were stable throughout rehab. Sats maintained greater than or equal to 92% on RA. Very compliant with attendance and home exercise. Patient plans to take some personal time off then come to the  Maintenance when he is ready. Great to work with.  Courtney L. Manson Passey, MS, NASM, CES       Agree with above.  Cathie Olden RN

## 2012-07-13 ENCOUNTER — Emergency Department (HOSPITAL_COMMUNITY): Payer: Medicare Other

## 2012-07-13 ENCOUNTER — Encounter (HOSPITAL_COMMUNITY): Payer: Self-pay | Admitting: Physical Medicine and Rehabilitation

## 2012-07-13 ENCOUNTER — Inpatient Hospital Stay (HOSPITAL_COMMUNITY)
Admission: EM | Admit: 2012-07-13 | Discharge: 2012-07-15 | DRG: 177 | Disposition: A | Discharge status: Home / Self Care | Payer: Medicare Other | Attending: Internal Medicine | Admitting: Internal Medicine

## 2012-07-13 DIAGNOSIS — E1149 Type 2 diabetes mellitus with other diabetic neurological complication: Secondary | ICD-10-CM | POA: Diagnosis present

## 2012-07-13 DIAGNOSIS — Z7982 Long term (current) use of aspirin: Secondary | ICD-10-CM

## 2012-07-13 DIAGNOSIS — R5383 Other fatigue: Secondary | ICD-10-CM | POA: Diagnosis present

## 2012-07-13 DIAGNOSIS — N189 Chronic kidney disease, unspecified: Secondary | ICD-10-CM | POA: Diagnosis present

## 2012-07-13 DIAGNOSIS — M109 Gout, unspecified: Secondary | ICD-10-CM | POA: Diagnosis present

## 2012-07-13 DIAGNOSIS — R05 Cough: Secondary | ICD-10-CM | POA: Diagnosis present

## 2012-07-13 DIAGNOSIS — J4489 Other specified chronic obstructive pulmonary disease: Secondary | ICD-10-CM | POA: Diagnosis present

## 2012-07-13 DIAGNOSIS — R911 Solitary pulmonary nodule: Secondary | ICD-10-CM

## 2012-07-13 DIAGNOSIS — E785 Hyperlipidemia, unspecified: Secondary | ICD-10-CM | POA: Diagnosis present

## 2012-07-13 DIAGNOSIS — Z87891 Personal history of nicotine dependence: Secondary | ICD-10-CM

## 2012-07-13 DIAGNOSIS — Z794 Long term (current) use of insulin: Secondary | ICD-10-CM

## 2012-07-13 DIAGNOSIS — R059 Cough, unspecified: Secondary | ICD-10-CM | POA: Diagnosis present

## 2012-07-13 DIAGNOSIS — J851 Abscess of lung with pneumonia: Secondary | ICD-10-CM | POA: Diagnosis present

## 2012-07-13 DIAGNOSIS — J189 Pneumonia, unspecified organism: Secondary | ICD-10-CM | POA: Diagnosis present

## 2012-07-13 DIAGNOSIS — Z902 Acquired absence of lung [part of]: Secondary | ICD-10-CM

## 2012-07-13 DIAGNOSIS — E1142 Type 2 diabetes mellitus with diabetic polyneuropathy: Secondary | ICD-10-CM | POA: Diagnosis present

## 2012-07-13 DIAGNOSIS — Z79899 Other long term (current) drug therapy: Secondary | ICD-10-CM

## 2012-07-13 DIAGNOSIS — R079 Chest pain, unspecified: Secondary | ICD-10-CM | POA: Diagnosis present

## 2012-07-13 DIAGNOSIS — I10 Essential (primary) hypertension: Secondary | ICD-10-CM | POA: Diagnosis present

## 2012-07-13 DIAGNOSIS — J449 Chronic obstructive pulmonary disease, unspecified: Secondary | ICD-10-CM | POA: Diagnosis present

## 2012-07-13 DIAGNOSIS — I129 Hypertensive chronic kidney disease with stage 1 through stage 4 chronic kidney disease, or unspecified chronic kidney disease: Secondary | ICD-10-CM | POA: Diagnosis present

## 2012-07-13 DIAGNOSIS — I2510 Atherosclerotic heart disease of native coronary artery without angina pectoris: Secondary | ICD-10-CM | POA: Diagnosis present

## 2012-07-13 DIAGNOSIS — E119 Type 2 diabetes mellitus without complications: Secondary | ICD-10-CM | POA: Diagnosis present

## 2012-07-13 DIAGNOSIS — J852 Abscess of lung without pneumonia: Principal | ICD-10-CM | POA: Diagnosis present

## 2012-07-13 DIAGNOSIS — R0600 Dyspnea, unspecified: Secondary | ICD-10-CM | POA: Diagnosis present

## 2012-07-13 LAB — CBC WITH DIFFERENTIAL/PLATELET
Basophils Absolute: 0 10*3/uL (ref 0.0–0.1)
Basophils Relative: 0 % (ref 0–1)
Eosinophils Absolute: 0.1 10*3/uL (ref 0.0–0.7)
Eosinophils Relative: 1 % (ref 0–5)
HCT: 48.2 % (ref 39.0–52.0)
Hemoglobin: 16.2 g/dL (ref 13.0–17.0)
Lymphocytes Relative: 14 % (ref 12–46)
Lymphs Abs: 1.3 10*3/uL (ref 0.7–4.0)
MCH: 28.9 pg (ref 26.0–34.0)
MCHC: 33.6 g/dL (ref 30.0–36.0)
MCV: 86.1 fL (ref 78.0–100.0)
Monocytes Absolute: 1.5 10*3/uL — ABNORMAL HIGH (ref 0.1–1.0)
Monocytes Relative: 16 % — ABNORMAL HIGH (ref 3–12)
Neutro Abs: 6.3 10*3/uL (ref 1.7–7.7)
Neutrophils Relative %: 69 % (ref 43–77)
Platelets: 183 10*3/uL (ref 150–400)
RBC: 5.6 MIL/uL (ref 4.22–5.81)
RDW: 13.4 % (ref 11.5–15.5)
WBC: 9.2 10*3/uL (ref 4.0–10.5)

## 2012-07-13 LAB — COMPREHENSIVE METABOLIC PANEL
ALT: 18 U/L (ref 0–53)
AST: 15 U/L (ref 0–37)
Albumin: 3.6 g/dL (ref 3.5–5.2)
Alkaline Phosphatase: 80 U/L (ref 39–117)
BUN: 19 mg/dL (ref 6–23)
CO2: 23 mEq/L (ref 19–32)
Calcium: 10.1 mg/dL (ref 8.4–10.5)
Chloride: 96 mEq/L (ref 96–112)
Creatinine, Ser: 0.85 mg/dL (ref 0.50–1.35)
GFR calc Af Amer: >90 mL/min (ref >90)
GFR calc non Af Amer: 89 mL/min — ABNORMAL LOW (ref >90)
Glucose, Bld: 208 mg/dL — ABNORMAL HIGH (ref 70–99)
Potassium: 3.8 mEq/L (ref 3.5–5.1)
Sodium: 134 mEq/L — ABNORMAL LOW (ref 135–145)
Total Bilirubin: 0.4 mg/dL (ref 0.3–1.2)
Total Protein: 7.7 g/dL (ref 6.0–8.3)

## 2012-07-13 LAB — CARDIAC PANEL(CRET KIN+CKTOT+MB+TROPI)
CK, MB: 2.5 ng/mL (ref 0.3–4.0)
Relative Index: INVALID (ref 0.0–2.5)
Troponin I: <0.3 ng/mL (ref <0.30)

## 2012-07-13 LAB — GLUCOSE, CAPILLARY
Glucose-Capillary: 216 mg/dL — ABNORMAL HIGH (ref 70–99)
Glucose-Capillary: 256 mg/dL — ABNORMAL HIGH (ref 70–99)

## 2012-07-13 LAB — TROPONIN I: Troponin I: <0.3 ng/mL (ref <0.30)

## 2012-07-13 MED ORDER — LOSARTAN POTASSIUM 50 MG PO TABS: 100.0000 mg | ORAL_TABLET | Freq: Every day | ORAL | Status: DC

## 2012-07-13 MED ORDER — MORPHINE SULFATE 2 MG/ML IJ SOLN
1.0000 mg | Freq: Once | INTRAMUSCULAR | Status: AC
  Administered 2012-07-13: 1 mg via INTRAVENOUS
  Filled 2012-07-13: qty 1

## 2012-07-13 MED ORDER — ALBUTEROL SULFATE (5 MG/ML) 0.5% IN NEBU: 2.5000 mg | INHALATION_SOLUTION | RESPIRATORY_TRACT | Status: DC | PRN

## 2012-07-13 MED ORDER — INSULIN ASPART 100 UNIT/ML ~~LOC~~ SOLN
0.0000 [IU] | Freq: Two times a day (BID) | SUBCUTANEOUS | Status: DC
  Administered 2012-07-14: 19 [IU] via SUBCUTANEOUS

## 2012-07-13 MED ORDER — HEPARIN SODIUM (PORCINE) 5000 UNIT/ML IJ SOLN
5000.0000 [IU] | Freq: Three times a day (TID) | INTRAMUSCULAR | Status: DC
  Administered 2012-07-13 – 2012-07-14 (×2): 5000 [IU] via SUBCUTANEOUS
  Filled 2012-07-13 (×5): qty 1

## 2012-07-13 MED ORDER — INSULIN GLARGINE 100 UNIT/ML ~~LOC~~ SOLN
45.0000 [IU] | Freq: Every day | SUBCUTANEOUS | Status: DC
  Administered 2012-07-13 – 2012-07-14 (×2): 45 [IU] via SUBCUTANEOUS

## 2012-07-13 MED ORDER — INSULIN ASPART 100 UNIT/ML ~~LOC~~ SOLN
0.0000 [IU] | Freq: Every day | SUBCUTANEOUS | Status: DC
  Administered 2012-07-14: 13 [IU] via SUBCUTANEOUS

## 2012-07-13 MED ORDER — INSULIN ASPART 100 UNIT/ML ~~LOC~~ SOLN
0.0000 [IU] | Freq: Once | SUBCUTANEOUS | Status: AC
  Administered 2012-07-13: 19 [IU] via SUBCUTANEOUS

## 2012-07-13 MED ORDER — SODIUM CHLORIDE 0.9 % IV SOLN
INTRAVENOUS | Status: AC
  Administered 2012-07-13: 20:00:00 via INTRAVENOUS

## 2012-07-13 MED ORDER — AMITRIPTYLINE HCL 50 MG PO TABS
50.0000 mg | ORAL_TABLET | Freq: Every day | ORAL | Status: DC
  Administered 2012-07-13 – 2012-07-14 (×2): 50 mg via ORAL
  Filled 2012-07-13 (×3): qty 1

## 2012-07-13 MED ORDER — INSULIN ASPART 100 UNIT/ML ~~LOC~~ SOLN: 0.0000 [IU] | Freq: Three times a day (TID) | SUBCUTANEOUS | Status: DC

## 2012-07-13 MED ORDER — INSULIN LISPRO 100 UNIT/ML ~~LOC~~ SOLN: 0.0000 [IU] | Freq: Three times a day (TID) | SUBCUTANEOUS | Status: DC

## 2012-07-13 MED ORDER — GABAPENTIN 600 MG PO TABS
1200.0000 mg | ORAL_TABLET | Freq: Two times a day (BID) | ORAL | Status: DC
  Administered 2012-07-13 – 2012-07-14 (×2): 1200 mg via ORAL
  Filled 2012-07-13 (×3): qty 2

## 2012-07-13 MED ORDER — INSULIN ASPART 100 UNIT/ML ~~LOC~~ SOLN: 0.0000 [IU] | Freq: Every day | SUBCUTANEOUS | Status: DC

## 2012-07-13 MED ORDER — ASPIRIN EC 81 MG PO TBEC
81.0000 mg | DELAYED_RELEASE_TABLET | Freq: Every day | ORAL | Status: DC
  Administered 2012-07-14 – 2012-07-15 (×2): 81 mg via ORAL
  Filled 2012-07-13 (×3): qty 1

## 2012-07-13 MED ORDER — CHLORTHALIDONE 25 MG PO TABS
25.0000 mg | ORAL_TABLET | Freq: Every day | ORAL | Status: DC
  Administered 2012-07-13 – 2012-07-15 (×3): 25 mg via ORAL
  Filled 2012-07-13 (×3): qty 1

## 2012-07-13 MED ORDER — IPRATROPIUM BROMIDE 0.02 % IN SOLN: 0.5000 mg | RESPIRATORY_TRACT | Status: DC | PRN

## 2012-07-13 MED ORDER — MORPHINE SULFATE 4 MG/ML IJ SOLN
4.0000 mg | Freq: Once | INTRAMUSCULAR | Status: AC
  Administered 2012-07-13: 4 mg via INTRAVENOUS
  Filled 2012-07-13: qty 1

## 2012-07-13 MED ORDER — BUDESONIDE-FORMOTEROL FUMARATE 160-4.5 MCG/ACT IN AERO
2.0000 | INHALATION_SPRAY | Freq: Two times a day (BID) | RESPIRATORY_TRACT | Status: DC
  Administered 2012-07-14 – 2012-07-15 (×2): 2 via RESPIRATORY_TRACT
  Filled 2012-07-13: qty 6

## 2012-07-13 MED ORDER — LOSARTAN POTASSIUM 50 MG PO TABS
100.0000 mg | ORAL_TABLET | Freq: Every day | ORAL | Status: DC
  Administered 2012-07-13 – 2012-07-15 (×3): 100 mg via ORAL
  Filled 2012-07-13 (×3): qty 2

## 2012-07-13 MED ORDER — SIMVASTATIN 40 MG PO TABS
40.0000 mg | ORAL_TABLET | Freq: Every day | ORAL | Status: DC
  Administered 2012-07-14: 40 mg via ORAL
  Filled 2012-07-13 (×2): qty 1

## 2012-07-13 NOTE — ED Notes (Addendum)
Pt states he is chest pain free at the time. Remains on cardiac monitor. No signs of distress noted. Vital signs stable.

## 2012-07-13 NOTE — ED Notes (Signed)
Lab at the bedside to re-draw.

## 2012-07-13 NOTE — ED Notes (Signed)
Floor unable to take report at the time. Nurse to call back. 

## 2012-07-13 NOTE — H&P (Signed)
Hospital Admission Note Date: 07/13/2012  Patient name: Anthony Petersen Medical record number: [id number] Date of birth: 08-17-1946 Age: 66 y.o. Gender: male PCP: Sid Falcon, MD  Medical Service: Internal Medicine Teaching Service  Attending physician: Dr. Kem Kays    1st Contact: Dr. Virgina Organ   Pager: [phone] 2nd Contact: Dr. Coralyn Pear   Pager: [phone] After 5 pm or weekends: 1st Contact:      Pager: [phone] 2nd Contact:      Pager: [phone]  Chief Complaint: Left sided chest pain  History of Present Illness: Anthony Petersen is a 66 year old male with PMH of COPD, DM with neuropathy, Hyperlipidemia, Pneumonia x5, and s/p left lower lobe lobectomy September 2012 presenting with complaints of left sided chest pain, shortness of breath, and fatigue x1 day and productive cough with white sputum x1 week.  Anthony Petersen explains his chest pain to be localized on the left, under the nipple, tender to palpation, never experienced before, rated at 4/10 on pain intensity, non-radiating.  The chest pain is worse with cough and improved with pain medication.  He also reports shortness of breath since last night and this morning feeling like he couldn't catch his breath, and associated with chills and weakness.  He claims to be feeling better today in regards to his weakness and shortness of breath, but still has the chest pain and productive cough.  He does not feel like these symptoms are similar to his previous 5 episodes of pneumonia, but was concerned especially after his lobectomy in 2012.  Anthony Petersen claims a long history of confusion in regards to his previous lung problems eventually leading to a lobectomy for a suspected mass.  He apparently followed up at [facility] after the procedure and was informed he did not have malignancy but likely bad infection.  He denies smoking, drinking alcohol, recent exposure to smoke, animals besides his household cats, and recent travel.  He also denies headaches, fever, N/V/D, or  abdominal pain at this time.  Anthony Petersen notes taking albuterol and Spriva at home occasionally.     Meds: No current outpatient prescriptions on file.  Allergies: Allergies as of 07/13/2012 - Review Complete 07/13/2012  Allergen Reaction Noted  . Prednisone Other (See Comments) 08/31/2011   Past Medical History  Diagnosis Date  . Abdominal pain, unspecified site   . Acute gouty arthropathy   . Acute sinusitis, unspecified   . Painful respiration   . Anorexia   . Essential hypertension, benign     Patient is currently not taking his blood pressure medications as prescribed.  . Corns and callosities   . Cellulitis and abscess of unspecified site   . Chronic airway obstruction, not elsewhere classified     2x30 quit 90 ? restriction/2x25 quit 90  . Pneumonia, organism unspecified     left lower lobe  . Unspecified constipation   . Cough   . Hemoptysis   . Peripheral autonomic neuropathy in disorders classified elsewhere   . Polyneuropathy in diabetes   . Disturbances of sensation of smell and taste   . Dizziness and giddiness   . Contracture of palmar fascia   . Unspecified essential hypertension     Hypertension Inadequately controlled  . Other malaise and fatigue   . Pain in limb     foot pain(soft tissue)  . Heartburn   . Hyperlipidemia   . Hypoxemia   . Pain in joint, upper arm     localized in the elbow  .  Stiffness of joint, not elsewhere classified, pelvic region and thigh     hip  . Unspecified pleural effusion     left-sided  . Other and unspecified disc disorder of lumbar region   . Swelling, mass, or lump in chest     LLL superior segment peripheral ?cavitations, pl changes  . Proteinuria   . Obstructive sleep apnea of adult   . Neuropathy due to secondary diabetes   . PAD (peripheral artery disease)   . Diabetes mellitus   . Diabetes mellitus associated with receptor abnormality   . Arrhythmia     irregular heart beat  . Coronary atherosclerosis of  unspecified type of vessel, native or graft   . Chronic kidney disease     early stages of renal failure   Past Surgical History  Procedure Date  . Lung removal, partial left lower lobe  . Cardiac catheterization     stent (back side of heart)  . Testicle surgery   . Vascular surgery 2012    stent r leg  . Arm debridement 2010    right arm  . Lumbar disc surgery 2001   Family History  Problem Relation Age of Onset  . Diabetes Mother   . Hypertension Mother   . Hyperlipidemia Mother   . COPD Father    History   Social History  . Marital Status: Married    Spouse Name: N/A    Number of Children: N/A  . Years of Education: N/A   Occupational History  . Not on file.   Social History Main Topics  . Smoking status: Former Smoker -- 2.0 packs/day for 15 years    Quit date: 12/13/1988  . Smokeless tobacco: Not on file  . Alcohol Use: No  . Drug Use:   . Sexually Active:    Other Topics Concern  . Not on file   Social History Narrative  . No narrative on file   Review of Systems: Pertinent items are noted in HPI.  Physical Exam: Blood pressure 162/75, pulse 73, temperature 98.7 F (37.1 C), temperature source Oral, resp. rate 18, SpO2 99.00%. Vitals reviewed. General: resting in bed, NAD HEENT: PERRL, EOMI, no scleral icterus Cardiac: RRR, no rubs, murmurs or gallops Pulm: clear to auscultation bilaterally, - crackles or wheezing, absent breath sounds in left lower lobe Abd: soft, nontender, obese, nondistended, BS present Ext: warm and well perfused, no pedal edema, +2dp b/l Neuro: alert and oriented X3, cranial nerves II-XII grossly intact, strength and sensation to light touch equal in bilateral upper and lower extremities.   Lab results: Basic Metabolic Panel:  Basename 07/13/12 1450  NA 134*  K 3.8  CL 96  CO2 23  GLUCOSE 208*  BUN 19  CREATININE 0.85  CALCIUM 10.1  MG --  PHOS --   Liver Function Tests:  Basename 07/13/12 1450  AST 15    ALT 18  ALKPHOS 80  BILITOT 0.4  PROT 7.7  ALBUMIN 3.6   CBC:  Basename 07/13/12 1450  WBC 9.2  NEUTROABS 6.3  HGB 16.2  HCT 48.2  MCV 86.1  PLT 183   Cardiac Enzymes:  Basename 07/13/12 1500  CKTOTAL --  CKMB --  CKMBINDEX --  TROPONINI <0.30   CBG:  Basename 07/13/12 1420  GLUCAP 216*   Imaging results:  Dg Chest Port 1 View  07/13/2012  *RADIOLOGY REPORT*  Clinical Data:  Left chest pain, COPD history of left lower lobectomy  PORTABLE CHEST - 1 VIEW  Comparison: Remote prior chest x-rays in 2003 and 2004 not currently available for review  Findings: There is blunting of the left cardiophrenic angle with associated left basilar opacity.  The lungs are negative for pulmonary edema.  The cardiac and mediastinal structures within normal limits.  No acute osseous abnormality.  IMPRESSION:  Left basilar opacity and blunting of the costophrenic angle.  Given the patient's history of prior left lower lobectomy, this may represent chronic post surgical changes and scarring.  However, a left sided pleural effusion with superimposed atelectasis and / or consolidation is difficult to exclude radiographically without recent prior imaging.  Consider further evaluation with CT scan of the chest.  Original Report Authenticated By: Vilma Prader   Other results: EKG: 81 BPM, normal sinus rhythm, borderline r wave progression, anterior leads  Assessment & Plan by Problem: Principal Problem:  *Chest pain Active Problems:  Cough  Dyspnea  Fatigue  S/P lobectomy of lung left lower lobe  66 year old man with past medical history significant for left video-assisted thoracoscopy for a lung mass suspicious for malignancyfollowed by left thoracotomy with left lower lobectomy, by Dr. Lavinia Sharps on 09/22/2011, DM  comes to the ER for chest pain for 1 day.   #Chest pain: Patient reports having an episode of chest pain that started yesterday, associated with some worsening SOB above his baseline. He states  that his chest pain gets worse with coughing and also sitting up. On exam, he is mildly tender to palpation, his lungs are clear with no adventious sounds. His CXR shows changes consistent with his surgery with suspicion for pleural effusion vs basilar opacity. Differentials for his chest pain includes pneumonia vs musculoskeletal . PE is less likely in the absence of risk factors( his WELL"s score is 0). There is no evidence of PTX or aortic dissection on CXR. Pneumonia or new onset pleural effusion is high on our differentials based on his CXR findings.  Admit to telemetry. Obtain one set of CE and CT chest. Continue symbicort. Duo nebs  # DM: check AIC. Continue home regimen- lantus( 45 units at bedtime) and SSI  # HTN: BP stable.  Continue cozaar and chlorthalidone  # Diabetic neuropathy: Continue Neurontin and amitriptyline  # Hyperlipidemia: Continue Zocor, check Lipid panel in AM  #DVT: Heparin   Signed: Darden Palmer 07/13/2012, 6:26 PM

## 2012-07-13 NOTE — ED Notes (Signed)
Pt presents to department via [facility] for evaluation of midsternal chest pain radiating to back. Onset last night. CP increases with deep breathing. Also states productive cough x1 week. 2/10 pain upon arrival. Skin warm to touch. 20g RAC. Pt is conscious alert and oriented x4. NSR on monitor. Wears 2L O2 at night while at home.

## 2012-07-13 NOTE — ED Notes (Signed)
Admitting physician at the bedside to write orders.

## 2012-07-13 NOTE — ED Notes (Signed)
CBG 216 

## 2012-07-13 NOTE — ED Provider Notes (Signed)
History     CSN: [id number]  Arrival date & time 07/13/12  1347   First MD Initiated Contact with Patient 07/13/12 1438      Chief Complaint  Patient presents with  . Chest Pain  . Shortness of Breath  . Cough    (Consider location/radiation/quality/duration/timing/severity/associated sxs/prior treatment) HPI Comments: Patient with PMH prior left lower lobectomy for what was thought to be cancer but turned out to be infection.  He started last night with weakness, burning in the left chest.  He has been coughing up phlegm for the past two days.  He denies fever or chills.  He has no history of prior cardiac disease except for a cath nine years ago that did not require intervention.  He does have pvd and has had a stent in his leg.  Patient is a 66 y.o. male presenting with chest pain, shortness of breath, and cough. The history is provided by the patient.  Chest Pain Episode onset: yesterday. Duration of episode(s) is 1 day. Chest pain occurs constantly. The chest pain is improving. The pain is associated with breathing and coughing. The severity of the pain is moderate. The quality of the pain is described as burning. The pain does not radiate. Chest pain is worsened by deep breathing (cough). Primary symptoms include shortness of breath and cough.  Associated symptoms include weakness.  Pertinent negatives for associated symptoms include no diaphoresis.    Shortness of Breath  Associated symptoms include chest pain, cough and shortness of breath.  Cough Associated symptoms include chest pain and shortness of breath.    Past Medical History  Diagnosis Date  . Abdominal pain, unspecified site   . Acute gouty arthropathy   . Acute sinusitis, unspecified   . Painful respiration   . Anorexia   . Essential hypertension, benign     Patient is currently not taking his blood pressure medications as prescribed.  . Corns and callosities   . Cellulitis and abscess of unspecified site     . Chronic airway obstruction, not elsewhere classified     2x30 quit 90 ? restriction/2x25 quit 90  . Pneumonia, organism unspecified     left lower lobe  . Unspecified constipation   . Cough   . Hemoptysis   . Peripheral autonomic neuropathy in disorders classified elsewhere   . Polyneuropathy in diabetes   . Disturbances of sensation of smell and taste   . Dizziness and giddiness   . Contracture of palmar fascia   . Unspecified essential hypertension     Hypertension Inadequately controlled  . Other malaise and fatigue   . Pain in limb     foot pain(soft tissue)  . Heartburn   . Hyperlipidemia   . Hypoxemia   . Pain in joint, upper arm     localized in the elbow  . Stiffness of joint, not elsewhere classified, pelvic region and thigh     hip  . Unspecified pleural effusion     left-sided  . Other and unspecified disc disorder of lumbar region   . Swelling, mass, or lump in chest     LLL superior segment peripheral ?cavitations, pl changes  . Proteinuria   . Obstructive sleep apnea of adult   . Neuropathy due to secondary diabetes   . PAD (peripheral artery disease)   . Diabetes mellitus   . Diabetes mellitus associated with receptor abnormality   . Arrhythmia     irregular heart beat  . Coronary atherosclerosis of  unspecified type of vessel, native or graft   . Chronic kidney disease     early stages of renal failure    Past Surgical History  Procedure Date  . Lung removal, partial left lower lobe  . Cardiac catheterization     stent (back side of heart)  . Testicle surgery   . Vascular surgery 2012    stent r leg  . Arm debridement 2010    right arm  . Lumbar disc surgery 2001    Family History  Problem Relation Age of Onset  . Diabetes Mother   . Hypertension Mother   . Hyperlipidemia Mother   . COPD Father     History  Substance Use Topics  . Smoking status: Former Smoker -- 2.0 packs/day for 15 years    Quit date: 12/13/1988  . Smokeless  tobacco: Not on file  . Alcohol Use: No      Review of Systems  Constitutional: Negative for diaphoresis.  Respiratory: Positive for cough and shortness of breath.   Cardiovascular: Positive for chest pain.  Neurological: Positive for weakness.  All other systems reviewed and are negative.    Allergies  Prednisone  Home Medications   Current Outpatient Rx  Name Route Sig Dispense Refill  . AMITRIPTYLINE HCL 50 MG PO TABS Oral Take 50 mg by mouth at bedtime.    . ASPIRIN EC 81 MG PO TBEC Oral Take 81 mg by mouth daily.    . BUDESONIDE-FORMOTEROL FUMARATE 160-4.5 MCG/ACT IN AERO Inhalation Inhale 2 puffs into the lungs 2 (two) times daily.      . CHLORTHALIDONE 25 MG PO TABS Oral Take 25 mg by mouth daily.    Marland Kitchen GABAPENTIN 600 MG PO TABS Oral Take 1,200 mg by mouth 2 (two) times daily.     Marland Kitchen GLUCOSE BLOOD VI STRP  Check BS prior to each meal and bedtime    . INSULIN ASPART 100 UNIT/ML [place] SOLN Subcutaneous Inject into the skin 3 (three) times daily before meals. Sliding scale based off of CBG readings Max dose is 70 units    . INSULIN GLARGINE 100 UNIT/ML [place] SOLN Subcutaneous Inject 45 Units into the skin at bedtime.     . INSULIN SYRINGE-NEEDLE U-100 31G X 5/16" 0.5 ML MISC  Use 4 shots per day     . LOSARTAN POTASSIUM 100 MG PO TABS Oral Take 100 mg by mouth daily.      Marland Kitchen ONE-DAILY MULTI VITAMINS PO TABS Oral Take 1 tablet by mouth daily.    Marland Kitchen PRAVASTATIN SODIUM 80 MG PO TABS Oral Take 80 mg by mouth at bedtime.      . ALBUTEROL SULFATE (2.5 MG/3ML) 0.083% IN NEBU Nebulization Take 2.5 mg by nebulization. Use 1 Unit Dose VIA Nebulizer 4 times a Day as Needed       BP 148/77  Pulse 80  Temp 98.3 F (36.8 C) (Oral)  Resp 18  SpO2 97%  Physical Exam  Nursing note and vitals reviewed. Constitutional: He is oriented to person, place, and time. He appears well-developed and well-nourished. No distress.  HENT:  Head: Normocephalic and atraumatic.  Mouth/Throat: Oropharynx is  clear and moist.  Neck: Normal range of motion. Neck supple.  Cardiovascular: Normal rate and regular rhythm.   No murmur heard. Pulmonary/Chest: Effort normal and breath sounds normal. No respiratory distress. He has no wheezes.  Abdominal: Soft. Bowel sounds are normal. He exhibits no distension. There is no tenderness.  Musculoskeletal: Normal range of  motion. He exhibits no edema.  Neurological: He is alert and oriented to person, place, and time.  Skin: Skin is warm and dry. He is not diaphoretic.    ED Course  Procedures (including critical care time)  Labs Reviewed  GLUCOSE, CAPILLARY - Abnormal; Notable for the following:    Glucose-Capillary 216 (*)     All other components within normal limits  CBC WITH DIFFERENTIAL  COMPREHENSIVE METABOLIC PANEL  TROPONIN I   Dg Chest Port 1 View  07/13/2012  *RADIOLOGY REPORT*  Clinical Data:  Left chest pain, COPD history of left lower lobectomy  PORTABLE CHEST - 1 VIEW  Comparison: Remote prior chest x-rays in 2003 and 2004 not currently available for review  Findings: There is blunting of the left cardiophrenic angle with associated left basilar opacity.  The lungs are negative for pulmonary edema.  The cardiac and mediastinal structures within normal limits.  No acute osseous abnormality.  IMPRESSION:  Left basilar opacity and blunting of the costophrenic angle.  Given the patient's history of prior left lower lobectomy, this may represent chronic post surgical changes and scarring.  However, a left sided pleural effusion with superimposed atelectasis and / or consolidation is difficult to exclude radiographically without recent prior imaging.  Consider further evaluation with CT scan of the chest.  Original Report Authenticated By: Vilma Prader     No diagnosis found.   Date: 07/13/2012  Rate: 81  Rhythm: normal sinus rhythm  QRS Axis: normal  Intervals: normal  ST/T Wave abnormalities: normal  Conduction Disutrbances:none  Narrative  Interpretation:   Old EKG Reviewed: none available    MDM  The patient presents with weakness, chest discomfort, and cough that he says feels like his prior episodes of pneumonia.  The troponin and ekg are okay, however he does have peripheral vascular disease that is concerning.  I have consulted outpatient clinics for admission.  They will see the patient.          Geoffery Lyons, MD 07/13/12 [phone]

## 2012-07-13 NOTE — Progress Notes (Signed)
Patient expressing concerns over home sliding scale being so different from our hospital sliding scale. MD called orders put in for home sliding scale.

## 2012-07-14 ENCOUNTER — Inpatient Hospital Stay (HOSPITAL_COMMUNITY): Payer: Medicare Other

## 2012-07-14 DIAGNOSIS — R05 Cough: Secondary | ICD-10-CM

## 2012-07-14 DIAGNOSIS — R5381 Other malaise: Secondary | ICD-10-CM

## 2012-07-14 DIAGNOSIS — R079 Chest pain, unspecified: Secondary | ICD-10-CM

## 2012-07-14 DIAGNOSIS — R0989 Other specified symptoms and signs involving the circulatory and respiratory systems: Secondary | ICD-10-CM

## 2012-07-14 DIAGNOSIS — E119 Type 2 diabetes mellitus without complications: Secondary | ICD-10-CM

## 2012-07-14 DIAGNOSIS — J984 Other disorders of lung: Secondary | ICD-10-CM

## 2012-07-14 DIAGNOSIS — J852 Abscess of lung without pneumonia: Principal | ICD-10-CM

## 2012-07-14 DIAGNOSIS — I10 Essential (primary) hypertension: Secondary | ICD-10-CM

## 2012-07-14 DIAGNOSIS — J851 Abscess of lung with pneumonia: Secondary | ICD-10-CM | POA: Diagnosis present

## 2012-07-14 DIAGNOSIS — Z9889 Other specified postprocedural states: Secondary | ICD-10-CM

## 2012-07-14 LAB — CBC WITH DIFFERENTIAL/PLATELET
Eosinophils Absolute: 0.2 10*3/uL (ref 0.0–0.7)
Eosinophils Relative: 2 % (ref 0–5)
HCT: 44 % (ref 39.0–52.0)
Lymphocytes Relative: 22 % (ref 12–46)
Lymphs Abs: 1.9 10*3/uL (ref 0.7–4.0)
MCH: 29.4 pg (ref 26.0–34.0)
MCV: 85.6 fL (ref 78.0–100.0)
Monocytes Absolute: 1.6 10*3/uL — ABNORMAL HIGH (ref 0.1–1.0)
RBC: 5.14 MIL/uL (ref 4.22–5.81)
WBC: 8.6 10*3/uL (ref 4.0–10.5)

## 2012-07-14 LAB — BASIC METABOLIC PANEL
BUN: 16 mg/dL (ref 6–23)
CO2: 28 mEq/L (ref 19–32)
Calcium: 9.4 mg/dL (ref 8.4–10.5)
Creatinine, Ser: 0.86 mg/dL (ref 0.50–1.35)
Glucose, Bld: 126 mg/dL — ABNORMAL HIGH (ref 70–99)

## 2012-07-14 LAB — COMPREHENSIVE METABOLIC PANEL
AST: 15 U/L (ref 0–37)
Albumin: 3.2 g/dL — ABNORMAL LOW (ref 3.5–5.2)
Alkaline Phosphatase: 67 U/L (ref 39–117)
Chloride: 100 mEq/L (ref 96–112)
Potassium: 3.9 mEq/L (ref 3.5–5.1)
Total Bilirubin: 0.3 mg/dL (ref 0.3–1.2)

## 2012-07-14 LAB — HEMOGLOBIN A1C: Mean Plasma Glucose: 229 mg/dL — ABNORMAL HIGH (ref <117)

## 2012-07-14 LAB — CBC
Platelets: 188 10*3/uL (ref 150–400)
RDW: 13.3 % (ref 11.5–15.5)
WBC: 9 10*3/uL (ref 4.0–10.5)

## 2012-07-14 LAB — GLUCOSE, CAPILLARY
Glucose-Capillary: 73 mg/dL (ref 70–99)
Glucose-Capillary: 179 mg/dL — ABNORMAL HIGH (ref 70–99)
Glucose-Capillary: 67 mg/dL — ABNORMAL LOW (ref 70–99)
Glucose-Capillary: 126 mg/dL — ABNORMAL HIGH (ref 70–99)

## 2012-07-14 LAB — LIPID PANEL
HDL: 46 mg/dL (ref >39)
LDL Cholesterol: 82 mg/dL (ref 0–99)
VLDL: 41 mg/dL — ABNORMAL HIGH (ref 0–40)

## 2012-07-14 MED ORDER — ACETAMINOPHEN 325 MG PO TABS
650.0000 mg | ORAL_TABLET | Freq: Four times a day (QID) | ORAL | Status: DC | PRN
  Administered 2012-07-14: 650 mg via ORAL
  Filled 2012-07-14: qty 2

## 2012-07-14 MED ORDER — MORPHINE SULFATE 4 MG/ML IJ SOLN
4.0000 mg | INTRAMUSCULAR | Status: DC | PRN
  Administered 2012-07-14: 4 mg via INTRAVENOUS
  Filled 2012-07-14: qty 1

## 2012-07-14 MED ORDER — MOXIFLOXACIN HCL IN NACL 400 MG/250ML IV SOLN
400.0000 mg | INTRAVENOUS | Status: DC
  Administered 2012-07-14: 400 mg via INTRAVENOUS
  Filled 2012-07-14 (×2): qty 250

## 2012-07-14 MED ORDER — GABAPENTIN 400 MG PO CAPS
1200.0000 mg | ORAL_CAPSULE | Freq: Two times a day (BID) | ORAL | Status: DC
  Administered 2012-07-14 – 2012-07-15 (×3): 1200 mg via ORAL
  Filled 2012-07-14 (×4): qty 3

## 2012-07-14 MED ORDER — IOHEXOL 300 MG/ML  SOLN
80.0000 mL | Freq: Once | INTRAMUSCULAR | Status: AC | PRN
  Administered 2012-07-14: 80 mL via INTRAVENOUS

## 2012-07-14 NOTE — Care Management Note (Unsigned)
    Page 1 of 1   07/14/2012     11:11:23 AM   CARE MANAGEMENT NOTE 07/14/2012  Patient:  Anthony Petersen, Anthony Petersen   Account Number:  [id number]  Date Initiated:  07/14/2012  Documentation initiated by:  GRAVES-BIGELOW,Makail Watling  Subjective/Objective Assessment:   Pt admitted with cp. Pt is from home with wife.     Action/Plan:   CM will continue to monitor for disposition needs.   Anticipated DC Date:  07/17/2012   Anticipated DC Plan:  HOME W HOME HEALTH SERVICES         Choice offered to / List presented to:             Status of service:  In process, will continue to follow Medicare Important Message given?   (If response is "NO", the following Medicare IM given date fields will be blank) Date Medicare IM given:   Date Additional Medicare IM given:    Discharge Disposition:    Per UR Regulation:  Reviewed for med. necessity/level of care/duration of stay  If discussed at Long Length of Stay Meetings, dates discussed:    Comments:

## 2012-07-14 NOTE — Progress Notes (Signed)
UR Completed Katleen Carraway Graves-Bigelow, RN,BSN [phone]  

## 2012-07-14 NOTE — Discharge Summary (Signed)
Internal Medicine Teaching [facility] Discharge Note  Name: Anthony Petersen MRN: [id number] DOB: 01-Jun-1946 66 y.o.  Date of Admission: 07/13/2012  1:47 PM Date of Discharge: 07/17/2012 Attending Physician: Dr. Hanley Ben Discharge Diagnosis: Principal Problem:  *Pneumonia with lung abscess Active Problems:  Chest pain  Cough  Dyspnea  Fatigue  S/P lobectomy of lung left lower lobe  Diabetes mellitus  Hypertension  Discharge Medications: Medication List  As of 07/17/2012  7:53 AM   TAKE these medications         albuterol (2.5 MG/3ML) 0.083% nebulizer solution   Commonly known as: PROVENTIL   Take 2.5 mg by nebulization every 6 (six) hours as needed. For shortness of breath.      amitriptyline 50 MG tablet   Commonly known as: ELAVIL   Take 50 mg by mouth at bedtime.      aspirin EC 81 MG tablet   Take 81 mg by mouth daily.      chlorthalidone 25 MG tablet   Commonly known as: HYGROTON   Take 25 mg by mouth daily.      COZAAR 100 MG tablet   Generic drug: losartan   Take 100 mg by mouth daily.      gabapentin 600 MG tablet   Commonly known as: NEURONTIN   Take 1,200 mg by mouth 2 (two) times daily.      insulin glargine 100 UNIT/ML injection   Commonly known as: LANTUS   Inject 40 Units into the skin at bedtime.      insulin lispro 100 UNIT/ML injection   Commonly known as: HUMALOG   Inject 0-31 Units into the skin 4 (four) times daily -  with meals and at bedtime. 70-90 = 9 units with breakfast, 5 units with lunch, 9 units with supper  91-130 (Base Dose) = 15 units with breakfast, 9 units with lunch, 15 units with supper  131-150 = 17 units with breakfast, 11 units with lunch, 17 units with supper  151-200 = 19 units with breakfast, 13 units with lunch, 19 units with supper  201-250 = 21 units with breakfast, 15 units with lunch, 21 units with supper  251-300 = 23 units with breakfast, 17 units with lunch, 23 units with supper, 1 unit at nighttime.    301-350 = 25 units with breakfast, 19 units with lunch, 25 units with supper, 2 units at nighttime.   351-400 = 27 units with breakfast, 21 units with lunch, 27 units with supper, 3 units at nighttime.   401-450 = 29 units with breakfast, 23 units with lunch, 29 units with supper, 4 units at nighttime.   >450 = 31 units with breakfast, 25 units with lunch, 31 units with supper, 5 units at nighttime.      moxifloxacin 400 MG tablet   Commonly known as: AVELOX   Take 1 tablet (400 mg total) by mouth daily.      multivitamin tablet   Take 1 tablet by mouth daily.      pravastatin 80 MG tablet   Commonly known as: PRAVACHOL   Take 80 mg by mouth at bedtime.      SYMBICORT 160-4.5 MCG/ACT inhaler   Generic drug: budesonide-formoterol   Inhale 2 puffs into the lungs 2 (two) times daily.           Disposition and follow-up:   Anthony Petersen was discharged from [facility] in Stable condition.  At the hospital follow up visit please address  complaints of dyspnea, repeat CT 3-6 months, and f/u pneumonia.  Follow-up Appointments: Follow-up Information    Follow up with [facility], [place].   Contact information:   [address] [place] [address] [phone]       Follow up with [facility] on 07/15/2012. (Please call to get the appointment time for August 23 rd. )    Contact information:   [address] [place] [address] [phone]        Discharge Orders    Future Appointments: Provider: Department: Dept Phone: Center:   08/31/2012 3:30 PM Kalman Shan, MD [facility] [phone] None     Future Orders Please Complete By Expires   Diet - low sodium heart healthy      Increase activity slowly      Discharge instructions      Comments:   Please keep up with your follow up appointments. Please call the number given with the discharge to get the appointment time for August 23 rd. Please follow up  with your PCP/ endocrinologist with CBG logs to adjust your insulin dosing.  I have decreased your lantus from 45 to 40 units based on your blood sugars.   Call MD for:  temperature >100.4      Call MD for:  persistant nausea and vomiting      Call MD for:  difficulty breathing, headache or visual disturbances        Consultations: Pulmonary  Procedures Performed:  Ct Chest W Contrast  07/14/2012  *RADIOLOGY REPORT*  Clinical Data: Cough, pleuritic chest pain, status post VATS on 09/2011  CT CHEST WITH CONTRAST  Technique:  Multidetector CT imaging of the chest was performed following the standard protocol during bolus administration of intravenous contrast.  Contrast: 80mL OMNIPAQUE IOHEXOL 300 MG/ML  SOLN  Comparison: Chest radiograph dated 07/13/2012  Findings: Prior left lower lobectomy with associated volume loss. Secondary pleural thickening/effusion at the left lung base, complex/loculated, including a vague area of enhancement within the pleural fluid (series 2/image 47).  Underlying moderate centrilobular emphysematous changes.  4 mm nodule in the right lower lobe (series 3/image 43).  Small suspected 3 mm nodule in the posterior right upper lobe (series 3/image 31).  No pneumothorax.  Although not optimally tailored for evaluation of the pulmonary arteries, no central pulmonary embolism is seen.  Visualized thyroid is heterogeneous/nodular.  The heart is normal in size.  No pericardial effusion.  Coronary atherosclerosis.  Mild atherosclerotic calcifications of the aortic arch.  Small mediastinal lymph nodes measuring up to 9 mm (series 2/image 25). No suspicious hilar or axillary lymphadenopathy.  The visualized upper abdomen is notable for hepatic steatosis.  Mild degenerative changes of the visualized thoracolumbar spine.  IMPRESSION: Prior left lower lobectomy with associated volume loss and postsurgical changes in the left hemithorax.  Complex pleural thickening/fluid with loculations and  suspected vague area of enhancement.  While nonspecific, infection/empyema or pleural tumor should be considered in the setting of pleuritic chest pain.  At least two small right lung nodules measuring up to 4 mm.  If this patient has a history of primary lung malignancy, comparison to prior images and/or attention on follow-up is suggested. Otherwise, follow-up CT chest is suggested in 12 months per Fleischner Society guidelines.  This recommendation follows the consensus statement: Guidelines for Management of Small Pulmonary Nodules Detected on CT Scans:  A Statement from the Fleischner Society as published in Radiology 2005; 237:395-400.  Original Report Authenticated By: Lurlean Horns  Rito Ehrlich, M.D.   [place] Chest  07/14/2012  *RADIOLOGY REPORT*  Clinical Data: Pleuritic chest pain,  History of VATS in 09/2011, possible left-sided effusion  CHEST ULTRASOUND  Comparison: CT scan with evidence of complex pleural thickening/fluid with loculations  Findings: The patient was initially set up as ultrasound-guided thoracentesis.  However, upon imaging with ultrasound, no significant free fluid or effusion is seen.  There are multiple thickened layers of dense tissue in the left lower chest cavity. However, no free fluid is seen to be accessible for thoracentesis. Thoracentesis was not performed.  IMPRESSION: Limited  left chest ultrasound reveals no significant effusion or fluid amendable to thoracentesis.  Read by Brayton El PA-C  Original Report Authenticated By: Judie Petit. Ruel Favors, M.D.   Dg Chest Port 1 View  07/13/2012  *RADIOLOGY REPORT*  Clinical Data:  Left chest pain, COPD history of left lower lobectomy  PORTABLE CHEST - 1 VIEW  Comparison: Remote prior chest x-rays in 2003 and 2004 not currently available for review  Findings: There is blunting of the left cardiophrenic angle with associated left basilar opacity.  The lungs are negative for pulmonary edema.  The cardiac and mediastinal structures within normal  limits.  No acute osseous abnormality.  IMPRESSION:  Left basilar opacity and blunting of the costophrenic angle.  Given the patient's history of prior left lower lobectomy, this may represent chronic post surgical changes and scarring.  However, a left sided pleural effusion with superimposed atelectasis and / or consolidation is difficult to exclude radiographically without recent prior imaging.  Consider further evaluation with CT scan of the chest.  Original Report Authenticated By: Vilma Prader   Admission HPI: Anthony Petersen is a 66 year old male with PMH of COPD, DM with neuropathy, Hyperlipidemia, Pneumonia x5, and s/p left lower lobe lobectomy September 2012 presenting with complaints of left sided chest pain, shortness of breath, and fatigue x1 day and productive cough with white sputum x1 week. Anthony Petersen explains his chest pain to be localized on the left, under the nipple, tender to palpation, never experienced before, rated at 4/10 on pain intensity, non-radiating. The chest pain is worse with cough and improved with pain medication. He also reports shortness of breath since last night and this morning feeling like he couldn't catch his breath, and associated with chills and weakness. He claims to be feeling better today in regards to his weakness and shortness of breath, but still has the chest pain and productive cough. He does not feel like these symptoms are similar to his previous 5 episodes of pneumonia, but was concerned especially after his lobectomy in 2012. Anthony Petersen claims a long history of confusion in regards to his previous lung problems eventually leading to a lobectomy for a suspected mass. He apparently followed up at [facility] after the procedure and was informed he did not have malignancy but likely bad infection. He denies smoking, drinking alcohol, recent exposure to smoke, animals besides his household cats, and recent travel. He also denies headaches, fever, N/V/D, or abdominal pain at this  time. Anthony Petersen notes taking albuterol and Spriva at home occasionally.   Hospital Course by problem list:   *Pneumonia with lung abscess -presented with one week of cough with white sputum and one day onset of fatigue, chills, and dyspnea.  S/p left lower lobe lobectomy October 2012.  CT chest significant for complex pleural thickening/fluid with loculations and suspected vague area of enhancement. Unable to have u/s guided thoracentesis for lack of access through  multiple layers of dense tissue and limited fluid.  Reports improvement in dyspnea since admission.  Started on Avelox 400mg  on 07/14/12 for total of 7 days.  Will follow up with pulmonary as outpatient.       Chest pain--point tenderness on left side under breast x1 day, worse with cough. Improved since admission, symptomatic pain relief with pain mediation.  Likely pleuritic in nature.   POC negative.   Diabetes mellitus--A1c: 9.6, blood sugars controlled on SSI. Follow up with endocrinologist as outpatient.    Hypertension--stable, continue Chlorthalidone and Losartan  Discharge Vitals:  BP 137/73  Pulse 82  Temp 97.9 F (36.6 C) (Oral)  Resp 19  Ht 6' (1.829 m)  Wt 221 lb 11.2 oz (100.562 kg)  BMI 30.07 kg/m2  SpO2 99%  Discharge Labs:  Results for Anthony Petersen, Anthony Petersen (MRN [id number]) as of 07/17/2012 07:53  Ref. Range 07/14/2012 06:05 07/14/2012 08:19  Sodium Latest Range: 135-145 mEq/L 139 138  Potassium Latest Range: 3.5-5.1 mEq/L 3.9 3.9  Chloride Latest Range: 96-112 mEq/L 100 100  CO2 Latest Range: 19-32 mEq/L 30 28  BUN Latest Range: 6-23 mg/dL 17 16  Creat Latest Range: 0.50-1.35 mg/dL 4.09 8.11  Calcium Latest Range: 8.4-10.5 mg/dL 9.6 9.4  GFR calc non Af Amer Latest Range: >90 mL/min 85 (L) 89 (L)  GFR calc Af Amer Latest Range: >90 mL/min >90 >90  Glucose Latest Range: 70-99 mg/dL 91 914 (H)  Alkaline Phosphatase Latest Range: 39-117 U/L 67   Albumin Latest Range: 3.5-5.2 g/dL 3.2 (L)   AST Latest Range: 0-37 U/L  15   ALT Latest Range: 0-53 U/L 15   Total Protein Latest Range: 6.0-8.3 g/dL 7.0   Total Bilirubin Latest Range: 0.3-1.2 mg/dL 0.3   Cholesterol Latest Range: 0-200 mg/dL 782   Triglycerides Latest Range: <150 mg/dL 956 (H)   HDL Latest Range: >39 mg/dL 46   LDL (calc) Latest Range: 0-99 mg/dL 82   VLDL Latest Range: 0-40 mg/dL 41 (H)   Total CHOL/HDL Ratio No range found 3.7   WBC Latest Range: 4.0-10.5 K/uL 9.0 8.6  RBC Latest Range: 4.22-5.81 MIL/uL 5.22 5.14  Hemoglobin Latest Range: 13.0-17.0 g/dL 21.3 08.6  HCT Latest Range: 39.0-52.0 % 44.9 44.0  MCV Latest Range: 78.0-100.0 fL 86.0 85.6  MCH Latest Range: 26.0-34.0 pg 29.1 29.4  MCHC Latest Range: 30.0-36.0 g/dL 57.8 46.9  RDW Latest Range: 11.5-15.5 % 13.3 13.2  Platelets Latest Range: 150-400 K/uL 188 184  Neutrophils Relative Latest Range: 43-77 %  57  Lymphocytes Relative Latest Range: 12-46 %  22  Monocytes Relative Latest Range: 3-12 %  18 (H)  Eosinophils Relative Latest Range: 0-5 %  2  Basophils Relative Latest Range: 0-1 %  1  NEUT# Latest Range: 1.7-7.7 K/uL  4.9  Lymphocytes Absolute Latest Range: 0.7-4.0 K/uL  1.9  Monocytes Absolute Latest Range: 0.1-1.0 K/uL  1.6 (H)  Eosinophils Absolute Latest Range: 0.0-0.7 K/uL  0.2  Basophils Absolute Latest Range: 0.0-0.1 K/uL  0.1   Blood Cx x2 pending  Signed: Darden Palmer 07/17/2012, 7:53 AM   Time Spent on Discharge: >30 minutes

## 2012-07-14 NOTE — Progress Notes (Signed)
Patient and wife complaining of leaving AMA because they are unhappy with the service they have received since admission. They were promised antibiotics after completion of the chest x-ray per ED doctor. They explained that they are irritated by differences in the hospital sliding scale for insulin versus the scale they use at home. Wife and patient appear to be very upset, and explain that they would like to speak with whoever is in charge. The wife explained that she notified [employer] of their dissatisfaction. Patient and wife consoled and assured that we are doing everything we can to provide the best care possible by myself, the charge RN, and previous shift Charity fundraiser. Will continue to monitor patient.

## 2012-07-14 NOTE — Progress Notes (Signed)
Subjective: Anthony Petersen was seen and examined at bedside today with his wife and sister in the room as well.  He claims to have slept through the night with little complaints and chest pain well controlled with medication.  He is tolerating his diet and claims to feel better since admission in regards to his fatigue and shortness of breath.  Anthony Petersen adamantly explains that he does not want to go through extensive testing similar to what he has gone through in the past.  He seems to have a complicated pulmonary history in regard to his left lung which is s/p VATS 2012 and questionable malignancy vs. Infection.  He is currently afebrile, denies chills, headaches, N/V/D, shortness of breath or abdominal pain at this time.  He still has point tenderness to his left chest under breast region, worse with coughing, controlled with pain medication, and has a productive cough with sputum.  He has agreed to provide a sputum sample if possible for culture and is agreement to attempt pleural fluid drainage and culture.   He was to go to IR today for ultrasound guided thoracentesis but was unsuccessful due to lack of adequate access secondary to multiple players of dense tissue and insufficient fluid.    Objective: Vital signs in last 24 hours: Filed Vitals:   07/13/12 2218 07/14/12 0300 07/14/12 0500 07/14/12 1100  BP: 167/86  125/74 138/72  Pulse:   75 78  Temp:   97.9 F (36.6 C) 98.3 F (36.8 C)  TempSrc:   Oral   Resp:    19  Height:  6' (1.829 m)    Weight:  221 lb 11.2 oz (100.562 kg)    SpO2:   95% 92%   Weight change:   Intake/Output Summary (Last 24 hours) at 07/14/12 1534 Last data filed at 07/14/12 1200  Gross per 24 hour  Intake    120 ml  Output      0 ml  Net    120 ml  Physical Exam: Vitals reviewed. General: resting in bed, NAD HEENT: PERRL, EOMI, no scleral icterus Cardiac: RRR, no rubs, murmurs or gallops Pulm: clear to auscultation, absent breath sounds LLL, no wheezes, Abd:  soft, nontender, nondistended, BS present Ext: warm and well perfused, no pedal edema, +2dp b/l Neuro: alert and oriented X3, cranial nerves II-XII grossly intact, strength and sensation to light touch equal in bilateral upper and lower extremities.  Lab Results: Basic Metabolic Panel:  Lab 07/14/12 1610 07/14/12 0605  NA 138 139  K 3.9 3.9  CL 100 100  CO2 28 30  GLUCOSE 126* 91  BUN 16 17  CREATININE 0.86 0.97  CALCIUM 9.4 9.6  MG -- --  PHOS -- --   Liver Function Tests:  Lab 07/14/12 0605 07/13/12 1450  AST 15 15  ALT 15 18  ALKPHOS 67 80  BILITOT 0.3 0.4  PROT 7.0 7.7  ALBUMIN 3.2* 3.6   CBC:  Lab 07/14/12 0819 07/14/12 0605 07/13/12 1450  WBC 8.6 9.0 --  NEUTROABS 4.9 -- 6.3  HGB 15.1 15.2 --  HCT 44.0 44.9 --  MCV 85.6 86.0 --  PLT 184 188 --   Cardiac Enzymes:  Lab 07/13/12 2101 07/13/12 1500  CKTOTAL 70 --  CKMB 2.5 --  CKMBINDEX -- --  TROPONINI <0.30 <0.30   CBG:  Lab 07/14/12 1137 07/14/12 0750 07/14/12 0154 07/14/12 0100 07/13/12 2135 07/13/12 2001  GLUCAP 179* 66* 154* 73 256* 160*   Hemoglobin A1C:  Lab  07/13/12 2101  HGBA1C 9.6*   Fasting Lipid Panel:  Lab 07/14/12 0605  CHOL 169  HDL 46  LDLCALC 82  TRIG 205*  CHOLHDL 3.7  LDLDIRECT --   Studies/Results: Ct Chest W Contrast  07/14/2012  *RADIOLOGY REPORT*  Clinical Data: Cough, pleuritic chest pain, status post VATS on 09/2011  CT CHEST WITH CONTRAST  Technique:  Multidetector CT imaging of the chest was performed following the standard protocol during bolus administration of intravenous contrast.  Contrast: 80mL OMNIPAQUE IOHEXOL 300 MG/ML  SOLN  Comparison: Chest radiograph dated 07/13/2012  Findings: Prior left lower lobectomy with associated volume loss. Secondary pleural thickening/effusion at the left lung base, complex/loculated, including a vague area of enhancement within the pleural fluid (series 2/image 47).  Underlying moderate centrilobular emphysematous changes.  4 mm  nodule in the right lower lobe (series 3/image 43).  Small suspected 3 mm nodule in the posterior right upper lobe (series 3/image 31).  No pneumothorax.  Although not optimally tailored for evaluation of the pulmonary arteries, no central pulmonary embolism is seen.  Visualized thyroid is heterogeneous/nodular.  The heart is normal in size.  No pericardial effusion.  Coronary atherosclerosis.  Mild atherosclerotic calcifications of the aortic arch.  Small mediastinal lymph nodes measuring up to 9 mm (series 2/image 25). No suspicious hilar or axillary lymphadenopathy.  The visualized upper abdomen is notable for hepatic steatosis.  Mild degenerative changes of the visualized thoracolumbar spine.  IMPRESSION: Prior left lower lobectomy with associated volume loss and postsurgical changes in the left hemithorax.  Complex pleural thickening/fluid with loculations and suspected vague area of enhancement.  While nonspecific, infection/empyema or pleural tumor should be considered in the setting of pleuritic chest pain.  At least two small right lung nodules measuring up to 4 mm.  If this patient has a history of primary lung malignancy, comparison to prior images and/or attention on follow-up is suggested. Otherwise, follow-up CT chest is suggested in 12 months per Fleischner Society guidelines.  This recommendation follows the consensus statement: Guidelines for Management of Small Pulmonary Nodules Detected on CT Scans:  A Statement from the Fleischner Society as published in Radiology 2005; 237:395-400.  Original Report Authenticated By: Charline Bills, M.D.   [place] Chest  07/14/2012  *RADIOLOGY REPORT*  Clinical Data: Pleuritic chest pain,  History of VATS in 09/2011, possible left-sided effusion  CHEST ULTRASOUND  Comparison: CT scan with evidence of complex pleural thickening/fluid with loculations  Findings: The patient was initially set up as ultrasound-guided thoracentesis.  However, upon imaging with  ultrasound, no significant free fluid or effusion is seen.  There are multiple thickened layers of dense tissue in the left lower chest cavity. However, no free fluid is seen to be accessible for thoracentesis. Thoracentesis was not performed.  IMPRESSION: Limited  left chest ultrasound reveals no significant effusion or fluid amendable to thoracentesis.  Read by Brayton El PA-C  Original Report Authenticated By: Judie Petit. Ruel Favors, M.D.   Dg Chest Port 1 View  07/13/2012  *RADIOLOGY REPORT*  Clinical Data:  Left chest pain, COPD history of left lower lobectomy  PORTABLE CHEST - 1 VIEW  Comparison: Remote prior chest x-rays in 2003 and 2004 not currently available for review  Findings: There is blunting of the left cardiophrenic angle with associated left basilar opacity.  The lungs are negative for pulmonary edema.  The cardiac and mediastinal structures within normal limits.  No acute osseous abnormality.  IMPRESSION:  Left basilar opacity and blunting of the costophrenic  angle.  Given the patient's history of prior left lower lobectomy, this may represent chronic post surgical changes and scarring.  However, a left sided pleural effusion with superimposed atelectasis and / or consolidation is difficult to exclude radiographically without recent prior imaging.  Consider further evaluation with CT scan of the chest.  Original Report Authenticated By: Vilma Prader   Medications: I have reviewed the patient's current medications. Scheduled Meds:    . amitriptyline  50 mg Oral QHS  . aspirin EC  81 mg Oral Daily  . budesonide-formoterol  2 puff Inhalation BID  . chlorthalidone  25 mg Oral Daily  . gabapentin  1,200 mg Oral BID  . insulin aspart  0-25 Units Subcutaneous Q1200  . insulin aspart  0-31 Units Subcutaneous BID WC  . insulin aspart  0-31 Units Subcutaneous Once  . insulin aspart  0-5 Units Subcutaneous QHS  . insulin glargine  45 Units Subcutaneous QHS  . losartan  100 mg Oral Daily  .  morphine  injection  1 mg Intravenous Once  . simvastatin  40 mg Oral q1800  . DISCONTD: gabapentin  1,200 mg Oral BID  . DISCONTD: heparin  5,000 Units Subcutaneous Q8H  . DISCONTD: insulin aspart  0-31 Units Subcutaneous TID WC & HS  . DISCONTD: insulin lispro  0-31 Units Subcutaneous TID WC & HS  . DISCONTD: losartan  100 mg Oral Daily   Continuous Infusions:   . sodium chloride 100 mL/hr at 07/13/12 2024   PRN Meds:.acetaminophen, albuterol, iohexol, ipratropium, morphine injection  Assessment/Plan: Anthony Petersen is a 66 year old male with PMH of COPD, DM with neuropathy, hyperlipidemia, multiple pneumonia, and s/p VATS left lower lobectomy October 2012 presenting with productive cough with white sputum x1 week and 1 day hx of shortness of breath, chills, and fatigue.  Anthony Petersen seems to have a complicated pulmonary history regarding uncertainty in regards to inflammatory vs. Malignant process in left lung in the past.  He is s/p VATs left lower lobe lobectomy and denies any hospitalizations since surgery in October 2012.  He claims to be seen by a pulmonologist at [facility] in November or December of 2012, Dr. Tyson Alias, who apparently also examined pleural fluid and and found it to be non-malignant.  CT chest revealed complex pleural thickening fluid with loculations and suspected vague area of enhancement suspicious for infection/empyema or tumor in addition to two small right lung nodules 4mm.  IR attempted ultrasound guided thoracentesis but could not proceed due to insufficient fluid and access through dense layers of tissue present.  As per pulmonary, we will treat with Avelox x7 days for empiric CAP treatment and ask Anthony Petersen to follow up as outpatient on August 23rd, 2013 with repeat CT scans likely in 3-6 months.   If adequate sputum sample can be obtained we will follow up on sputum cultures as well.    1) Start Avelox 400mg --Day 1  2) f/u outpatient with pulmonary  on August 23rd, 2013 and repeat  CT scan in 3-6 months 3) f/u with Dr. Marchelle Gearing in September 2013 4) f/u sputum and blood cultures 5) Continue pain management with morphine PRN 6) monitor glucose, insulin sliding scale as per home regimen   Pneumonia with lung abscess (07/14/2012)--afebrile, no leukocytosis, previous history, currently has productive cough with white sputum x1 week, worse at night. -Appreciate pulmonary consult: Recommend Avelox x7 days for empiric treatment of CAP and follow up outpatient with Dr. Conni Elliot on August 23rd, 2013.  Repeat CT scan  in 3-6 days.   Chest pain (07/13/2012)--likely pleuritic secondary to possible complicated pneumonia Worse with cough -symptomatic pain relief with morphine PRN  Dyspnea (07/13/2012)-likely secondary to possible pneumonia. Much improved since admission -continue to monitor  Fatigue (07/13/2012)--onset x1 day, improved since admission  S/P lobectomy of lung left lower lobe (07/13/2012)   Anthony Petersen and his wife claim to have had pulmonary work up in the past at [facility] and also followed up with a pulmonologist at [facility] (Dr. Tyson Alias?) s/p lobectomy in October 2012. In October 2012, Dr. Laneta Simmers reports an ill-defined infiltrative process of the lower lower lobe of the lung, transbronchial lung biopsy from 07/2011 showed rare atypical cells suspicious for carcinoma but subsequent needle biopsy showed organizign pneumonia with interstitial chronic inflammation with no evidence malignancy.  Subsequently, PET scan from September 2012 showed consioidation of left lower lobe with low level uptake and mild uptake in both parotid glands as well.  It remained unclear if the process in left lung was inflammatory or neoplastic.  This eventually led to left VATS, left thoracotomy with left lower lobectomy.    Diabetes mellitus (07/13/2012)--A1c: 9.6, patient and wife wish to be on strict sliding scale as per home regimen -continue sliding scale -monitor BG  Hypertension  (07/13/2012) Today: 138/78 -Continue Losartan  Diet: Regular DVT Prophylaxis: SCDs in place Dispo: likely discharged tomorrow with Avelox x7 days total pending further clinical improvement.  Anthony Petersen will need to follow up with pulmonary as outpatient with already schedule appointments.   LOS: 1 day   Darden Palmer 07/14/2012, 3:34 PM

## 2012-07-14 NOTE — Consult Note (Signed)
Name: Anthony Petersen MRN: [id number] DOB: 10-12-1946    LOS: 1  Referring Provider:  paya  Reason for Referral:  Loculated effusion.   PULMONARY / CRITICAL CARE MEDICINE  HPI:   This is a 66 year old male who presented on 8/1 with 1 day h/o fatigue and productive cough. Also reported non-radiating left sided chest pain, as well as chills and fever. He was admitted to the med-surg ward. Diagnostic evaluation demonstrated left loculated effusion as well as post-op lobectomy changes. Pulm was asked to see for abnormal CT scan. Of not [place] at bedside did not demonstrate adequate drainable fluid collection. He has now been in the hospital since 8/1, on 8/2 he feels better. No leukocytosis, no fever or chills. Reports CP to have improved.   Past Medical History  Diagnosis Date  . Abdominal pain, unspecified site   . Acute gouty arthropathy   . Acute sinusitis, unspecified   . Painful respiration   . Anorexia   . Essential hypertension, benign     Patient is currently not taking his blood pressure medications as prescribed.  . Corns and callosities   . Cellulitis and abscess of unspecified site   . Chronic airway obstruction, not elsewhere classified     2x30 quit 90 ? restriction/2x25 quit 90  . Pneumonia, organism unspecified     left lower lobe  . Unspecified constipation   . Cough   . Hemoptysis   . Peripheral autonomic neuropathy in disorders classified elsewhere   . Polyneuropathy in diabetes   . Disturbances of sensation of smell and taste   . Dizziness and giddiness   . Contracture of palmar fascia   . Unspecified essential hypertension     Hypertension Inadequately controlled  . Other malaise and fatigue   . Pain in limb     foot pain(soft tissue)  . Heartburn   . Hyperlipidemia   . Hypoxemia   . Pain in joint, upper arm     localized in the elbow  . Stiffness of joint, not elsewhere classified, pelvic region and thigh     hip  . Unspecified pleural effusion    left-sided  . Other and unspecified disc disorder of lumbar region   . Swelling, mass, or lump in chest     LLL superior segment peripheral ?cavitations, pl changes  . Proteinuria   . Obstructive sleep apnea of adult   . Neuropathy due to secondary diabetes   . PAD (peripheral artery disease)   . Diabetes mellitus   . Diabetes mellitus associated with receptor abnormality   . Arrhythmia     irregular heart beat  . Coronary atherosclerosis of unspecified type of vessel, native or graft   . Chronic kidney disease     early stages of renal failure   Past Surgical History  Procedure Date  . Lung removal, partial left lower lobe  . Cardiac catheterization     stent (back side of heart)  . Testicle surgery   . Vascular surgery 2012    stent r leg  . Arm debridement 2010    right arm  . Lumbar disc surgery 2001   Prior to Admission medications   Medication Sig Start Date End Date Taking? Authorizing Provider  albuterol (PROVENTIL) (2.5 MG/3ML) 0.083% nebulizer solution Take 2.5 mg by nebulization every 6 (six) hours as needed. For shortness of breath.   Yes Historical Provider, MD  amitriptyline (ELAVIL) 50 MG tablet Take 50 mg by mouth at bedtime.  Yes Historical Provider, MD  aspirin EC 81 MG tablet Take 81 mg by mouth daily.   Yes Historical Provider, MD  budesonide-formoterol (SYMBICORT) 160-4.5 MCG/ACT inhaler Inhale 2 puffs into the lungs 2 (two) times daily.     Yes Historical Provider, MD  chlorthalidone (HYGROTON) 25 MG tablet Take 25 mg by mouth daily.   Yes Historical Provider, MD  gabapentin (NEURONTIN) 600 MG tablet Take 1,200 mg by mouth 2 (two) times daily.    Yes Historical Provider, MD  insulin glargine (LANTUS) 100 UNIT/ML injection Inject 45 Units into the skin at bedtime.    Yes Historical Provider, MD  insulin lispro (HUMALOG) 100 UNIT/ML injection Inject 0-31 Units into the skin 4 (four) times daily -  with meals and at bedtime. 70-90 = 9 units with breakfast, 5  units with lunch, 9 units with supper 91-130 (Base Dose) = 15 units with breakfast, 9 units with lunch, 15 units with supper 131-150 = 17 units with breakfast, 11 units with lunch, 17 units with supper 151-200 = 19 units with breakfast, 13 units with lunch, 19 units with supper 201-250 = 21 units with breakfast, 15 units with lunch, 21 units with supper 251-300 = 23 units with breakfast, 17 units with lunch, 23 units with supper, 1 unit at nighttime.  301-350 = 25 units with breakfast, 19 units with lunch, 25 units with supper, 2 units at nighttime.  351-400 = 27 units with breakfast, 21 units with lunch, 27 units with supper, 3 units at nighttime.  401-450 = 29 units with breakfast, 23 units with lunch, 29 units with supper, 4 units at nighttime.  >450 = 31 units with breakfast, 25 units with lunch, 31 units with supper, 5 units at nighttime.   Yes Historical Provider, MD  losartan (COZAAR) 100 MG tablet Take 100 mg by mouth daily.     Yes Historical Provider, MD  Multiple Vitamin (MULTIVITAMIN) tablet Take 1 tablet by mouth daily.   Yes Historical Provider, MD  pravastatin (PRAVACHOL) 80 MG tablet Take 80 mg by mouth at bedtime.     Yes Historical Provider, MD   Allergies Allergies  Allergen Reactions  . Prednisone Other (See Comments)    hyperglycemia    Family History Family History  Problem Relation Age of Onset  . Diabetes Mother   . Hypertension Mother   . Hyperlipidemia Mother   . COPD Father    Social History  reports that he quit smoking about 23 years ago. He does not have any smokeless tobacco history on file. He reports that he does not drink alcohol. His drug history not on file.  Review Of Systems:   Review of Systems  Constitutional: No weight loss, gain, night sweats, transient Fevers, transient chills,transient fatigue .  HEENT: No headaches, visual changes, Difficulty swallowing, Tooth/dental problems, or Sore throat,  No sneezing, itching, ear ache, nasal  congestion, post nasal drip, no visual complaints CV: chest pain improved, Orthopnea, PND, swelling in lower extremities, dizziness, palpitations, syncope.  GI No heartburn, indigestion, abdominal pain, nausea, vomiting, diarrhea, change in bowel habits, loss of appetite, bloody stools.  Resp: cough improved, No coughing up of blood. No change in color of mucus. No wheezing.  Skin: no rash or itching or icterus GU: no dysuria, change in color of urine, no urgency or frequency. No flank pain, no hematuria  MS: No joint pain or swelling. No decreased range of motion  Psych: No change in mood or affect. No depression or anxiety.  Neuro: no difficulty with speech, weakness, numbness, ataxia   Brief patient description:   This is a 66 year old male who presented on 8/1 with 1 day h/o fatigue and productive cough. Also reported non-radiating left sided chest pain, as well as chills and fever. He was admitted to the med-surg ward. Diagnostic evaluation demonstrated left loculated effusion as well as post-op lobectomy changes. PCCM was asked to see in evaluation for abnormal CT scan.   Current Status: Feels better Vital Signs: Temp:  [97.9 F (36.6 C)-99.1 F (37.3 C)] 98.3 F (36.8 C) (08/02 1100) Pulse Rate:  [73-86] 78  (08/02 1100) Resp:  [18-20] 19  (08/02 1100) BP: (125-171)/(66-86) 138/72 mmHg (08/02 1100) SpO2:  [92 %-99 %] 92 % (08/02 1100) Weight:  [221 lb 11.2 oz (100.562 kg)] 221 lb 11.2 oz (100.562 kg) (08/02 0300) Room air  Physical Examination: General:  Well developed male, no acute distress Neuro:  Awake and oriented no focal def  HEENT:  [place] no JVD Cardiovascular:  rrr Lungs:  Decreased on the left Abdomen:  Soft, non-tender  Musculoskeletal:  intact Skin:  intact  Principal Problem:  *Pneumonia with lung abscess Active Problems:  Chest pain  Cough  Fatigue  Dyspnea  S/P lobectomy of lung left lower lobe  Diabetes mellitus  Hypertension   ASSESSMENT AND  PLAN Possible CAP, in setting of pt w/ prior lobectomy after lung abscess. Has small loculated pleural effusion which has been evaluated by [place] and is to small to tap. Also has possible infiltrate. Given presentation this could represent early pneumonia.  Recommendation.  -LLLobectomy , results in a space that fills naturally with fluid post op -would empirically cover w/ 7 d avelox, given small amount of infiltrate on top of this small effusion -have set him up w/ return visit in our office on Aug 23 for dyspnea evaluation and timing of repeat CT scan in 3-6  -appointment has been made for September w/ Ramaswamy.  Need to obtain entire path report from Left lower lobectomy He refused pred in past and CT now , not c/w BOOP. He states that path also not c/w boop -He knows Dr Marchelle Gearing from rehab and we have recommended him to see Dr Marchelle Gearing -keep in hospital for further observation at least 24 hrs , to ensure no continued fevers or clinical status  Will sign off, call if needed  Mcarthur Rossetti. Tyson Alias, MD, FACP Pgr: [phone] [place] [facility]

## 2012-07-14 NOTE — Progress Notes (Signed)
Thoracentesis not performed due to insufficient fluid amenable to access. Please see Limited Chest [place] report.  Barbee Shropshire [facility] 07/14/2012 2:46 PM

## 2012-07-14 NOTE — H&P (Signed)
INTERNAL MEDICINE TEACHING SERVICE Attending Admission Note  Date: 07/14/2012  Patient name: Anthony Petersen  Medical record number: [id number]  Date of birth: 11-17-1946    I have seen and evaluated Anthony Petersen and discussed their care with the Residency Team.  66 yr. 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 He described his pain to be located over left chest, worse with coughing and perhaps with inspiration. He is noted to be afebrile, have no leukocytosis, and clinically stable. CXR shows left basilar opacity with blunting. CT chest shows complex pleural thickening and fluid with possibly loculation concerning for empyema or underlying malignancy (study reviewed personally by me). He reports previous treatment with antibiotics, prednisone (which caused uncontrolled DM), and lung biopsy/resection with unclear results. He states he is not interested in any other surgical procedures but is agreeable to sampling of this fluid if needed. Currently, he feels well. Admits to productive cough. Denies pressure like CP. Denies SOB. Denies chills.  Physical Exam: Blood pressure 125/74, pulse 75, temperature 97.9 F (36.6 C), temperature source Oral, resp. rate 20, height 6' (1.829 m), weight 221 lb 11.2 oz (100.562 kg), SpO2 95.00%.  General: Vital signs reviewed and noted. Well-developed, well-nourished, in no acute distress; alert, appropriate and cooperative throughout examination.  Head: Normocephalic, atraumatic.  Eyes: PERRL, EOMI, No signs of anemia or jaundince.  Nose: Mucous membranes moist, not inflammed, nonerythematous.  Throat: Oropharynx nonerythematous, no exudate appreciated.   Neck: No deformities, masses, or tenderness noted.Supple, No carotid Bruits, no JVD.  Lungs:  Normal respiratory effort. Decreased Left basilar breath sounds. No wheeze.    Heart: RRR. S1 and S2 normal without gallop, murmur, or rubs.  Abdomen:  BS normoactive. Soft, Nondistended, non-tender.  No masses or organomegaly.  Extremities: No pretibial edema.  Neurologic: A&O X3, CN II - XII are grossly intact. Motor strength is 5/5 in the all 4 extremities, Sensations intact to light touch, Cerebellar signs negative.  Skin: No visible rashes, scars.    Lab results: Results for orders placed during the hospital encounter of 07/13/12 (from the past 24 hour(s))  GLUCOSE, CAPILLARY     Status: Abnormal   Collection Time   07/13/12  2:20 PM      Component Value Range   Glucose-Capillary 216 (*) 70 - 99 mg/dL  CBC WITH DIFFERENTIAL     Status: Abnormal   Collection Time   07/13/12  2:50 PM      Component Value Range   WBC 9.2  4.0 - 10.5 K/uL   RBC 5.60  4.22 - 5.81 MIL/uL   Hemoglobin 16.2  13.0 - 17.0 g/dL   HCT 14.7  82.9 - 56.2 %   MCV 86.1  78.0 - 100.0 fL   MCH 28.9  26.0 - 34.0 pg   MCHC 33.6  30.0 - 36.0 g/dL   RDW 13.0  86.5 - 78.4 %   Platelets 183  150 - 400 K/uL   Neutrophils Relative 69  43 - 77 %   Lymphocytes Relative 14  12 - 46 %   Monocytes Relative 16 (*) 3 - 12 %   Eosinophils Relative 1  0 - 5 %   Basophils Relative 0  0 - 1 %   Neutro Abs 6.3  1.7 - 7.7 K/uL   Lymphs Abs 1.3  0.7 - 4.0 K/uL  Monocytes Absolute 1.5 (*) 0.1 - 1.0 K/uL   Eosinophils Absolute 0.1  0.0 - 0.7 K/uL   Basophils Absolute 0.0  0.0 - 0.1 K/uL  COMPREHENSIVE METABOLIC PANEL     Status: Abnormal   Collection Time   07/13/12  2:50 PM      Component Value Range   Sodium 134 (*) 135 - 145 mEq/L   Potassium 3.8  3.5 - 5.1 mEq/L   Chloride 96  96 - 112 mEq/L   CO2 23  19 - 32 mEq/L   Glucose, Bld 208 (*) 70 - 99 mg/dL   BUN 19  6 - 23 mg/dL   Creatinine, Ser 1.61  0.50 - 1.35 mg/dL   Calcium 09.6  8.4 - 04.5 mg/dL   Total Protein 7.7  6.0 - 8.3 g/dL   Albumin 3.6  3.5 - 5.2 g/dL   AST 15  0 - 37 U/L   ALT 18  0 - 53 U/L   Alkaline Phosphatase 80  39 - 117 U/L    Total Bilirubin 0.4  0.3 - 1.2 mg/dL   GFR calc non Af Amer 89 (*) >90 mL/min   GFR calc Af Amer >90  >90 mL/min  TROPONIN I     Status: Normal   Collection Time   07/13/12  3:00 PM      Component Value Range   Troponin I <0.30  <0.30 ng/mL  GLUCOSE, CAPILLARY     Status: Abnormal   Collection Time   07/13/12  8:01 PM      Component Value Range   Glucose-Capillary 160 (*) 70 - 99 mg/dL   Comment 1 Notify RN    HEMOGLOBIN A1C     Status: Abnormal   Collection Time   07/13/12  9:01 PM      Component Value Range   Hemoglobin A1C 9.6 (*) <5.7 %   Mean Plasma Glucose 229 (*) <117 mg/dL  CARDIAC PANEL(CRET KIN+CKTOT+MB+TROPI)     Status: Normal   Collection Time   07/13/12  9:01 PM      Component Value Range   Total CK 70  7 - 232 U/L   CK, MB 2.5  0.3 - 4.0 ng/mL   Troponin I <0.30  <0.30 ng/mL   Relative Index RELATIVE INDEX IS INVALID  0.0 - 2.5  GLUCOSE, CAPILLARY     Status: Abnormal   Collection Time   07/13/12  9:35 PM      Component Value Range   Glucose-Capillary 256 (*) 70 - 99 mg/dL   Comment 1 Notify RN    GLUCOSE, CAPILLARY     Status: Normal   Collection Time   07/14/12  1:00 AM      Component Value Range   Glucose-Capillary 73  70 - 99 mg/dL  GLUCOSE, CAPILLARY     Status: Abnormal   Collection Time   07/14/12  1:54 AM      Component Value Range   Glucose-Capillary 154 (*) 70 - 99 mg/dL   Comment 1 Notify RN    CBC     Status: Normal   Collection Time   07/14/12  6:05 AM      Component Value Range   WBC 9.0  4.0 - 10.5 K/uL   RBC 5.22  4.22 - 5.81 MIL/uL   Hemoglobin 15.2  13.0 - 17.0 g/dL   HCT 40.9  81.1 - 91.4 %   MCV 86.0  78.0 - 100.0 fL   MCH 29.1  26.0 - 34.0 pg   MCHC 33.9  30.0 - 36.0 g/dL   RDW 16.1  09.6 - 04.5 %   Platelets 188  150 - 400 K/uL  COMPREHENSIVE METABOLIC PANEL     Status: Abnormal   Collection Time   07/14/12  6:05 AM      Component Value Range   Sodium 139  135 - 145 mEq/L   Potassium 3.9  3.5 - 5.1 mEq/L   Chloride 100  96 - 112  mEq/L   CO2 30  19 - 32 mEq/L   Glucose, Bld 91  70 - 99 mg/dL   BUN 17  6 - 23 mg/dL   Creatinine, Ser 4.09  0.50 - 1.35 mg/dL   Calcium 9.6  8.4 - 81.1 mg/dL   Total Protein 7.0  6.0 - 8.3 g/dL   Albumin 3.2 (*) 3.5 - 5.2 g/dL   AST 15  0 - 37 U/L   ALT 15  0 - 53 U/L   Alkaline Phosphatase 67  39 - 117 U/L   Total Bilirubin 0.3  0.3 - 1.2 mg/dL   GFR calc non Af Amer 85 (*) >90 mL/min   GFR calc Af Amer >90  >90 mL/min  LIPID PANEL     Status: Abnormal   Collection Time   07/14/12  6:05 AM      Component Value Range   Cholesterol 169  0 - 200 mg/dL   Triglycerides 914 (*) <150 mg/dL   HDL 46  >78 mg/dL   Total CHOL/HDL Ratio 3.7     VLDL 41 (*) 0 - 40 mg/dL   LDL Cholesterol 82  0 - 99 mg/dL  GLUCOSE, CAPILLARY     Status: Abnormal   Collection Time   07/14/12  7:50 AM      Component Value Range   Glucose-Capillary 66 (*) 70 - 99 mg/dL   Comment 1 Notify RN    CBC WITH DIFFERENTIAL     Status: Abnormal   Collection Time   07/14/12  8:19 AM      Component Value Range   WBC 8.6  4.0 - 10.5 K/uL   RBC 5.14  4.22 - 5.81 MIL/uL   Hemoglobin 15.1  13.0 - 17.0 g/dL   HCT 29.5  62.1 - 30.8 %   MCV 85.6  78.0 - 100.0 fL   MCH 29.4  26.0 - 34.0 pg   MCHC 34.3  30.0 - 36.0 g/dL   RDW 65.7  84.6 - 96.2 %   Platelets 184  150 - 400 K/uL   Neutrophils Relative 57  43 - 77 %   Neutro Abs 4.9  1.7 - 7.7 K/uL   Lymphocytes Relative 22  12 - 46 %   Lymphs Abs 1.9  0.7 - 4.0 K/uL   Monocytes Relative 18 (*) 3 - 12 %   Monocytes Absolute 1.6 (*) 0.1 - 1.0 K/uL   Eosinophils Relative 2  0 - 5 %   Eosinophils Absolute 0.2  0.0 - 0.7 K/uL   Basophils Relative 1  0 - 1 %   Basophils Absolute 0.1  0.0 - 0.1 K/uL  BASIC METABOLIC PANEL     Status: Abnormal   Collection Time   07/14/12  8:19 AM      Component Value Range   Sodium 138  135 - 145 mEq/L   Potassium 3.9  3.5 - 5.1 mEq/L   Chloride 100  96 - 112 mEq/L   CO2  28  19 - 32 mEq/L   Glucose, Bld 126 (*) 70 - 99 mg/dL   BUN 16  6 -  23 mg/dL   Creatinine, Ser 1.61  0.50 - 1.35 mg/dL   Calcium 9.4  8.4 - 09.6 mg/dL   GFR calc non Af Amer 89 (*) >90 mL/min   GFR calc Af Amer >90  >90 mL/min  GLUCOSE, CAPILLARY     Status: Abnormal   Collection Time   07/14/12 11:37 AM      Component Value Range   Glucose-Capillary 179 (*) 70 - 99 mg/dL   Comment 1 Notify RN      Imaging results:  Ct Chest W Contrast  07/14/2012  *RADIOLOGY REPORT*  Clinical Data: Cough, pleuritic chest pain, status post VATS on 09/2011  CT CHEST WITH CONTRAST  Technique:  Multidetector CT imaging of the chest was performed following the standard protocol during bolus administration of intravenous contrast.  Contrast: 80mL OMNIPAQUE IOHEXOL 300 MG/ML  SOLN  Comparison: Chest radiograph dated 07/13/2012  Findings: Prior left lower lobectomy with associated volume loss. Secondary pleural thickening/effusion at the left lung base, complex/loculated, including a vague area of enhancement within the pleural fluid (series 2/image 47).  Underlying moderate centrilobular emphysematous changes.  4 mm nodule in the right lower lobe (series 3/image 43).  Small suspected 3 mm nodule in the posterior right upper lobe (series 3/image 31).  No pneumothorax.  Although not optimally tailored for evaluation of the pulmonary arteries, no central pulmonary embolism is seen.  Visualized thyroid is heterogeneous/nodular.  The heart is normal in size.  No pericardial effusion.  Coronary atherosclerosis.  Mild atherosclerotic calcifications of the aortic arch.  Small mediastinal lymph nodes measuring up to 9 mm (series 2/image 25). No suspicious hilar or axillary lymphadenopathy.  The visualized upper abdomen is notable for hepatic steatosis.  Mild degenerative changes of the visualized thoracolumbar spine.  IMPRESSION: Prior left lower lobectomy with associated volume loss and postsurgical changes in the left hemithorax.  Complex pleural thickening/fluid with loculations and suspected vague  area of enhancement.  While nonspecific, infection/empyema or pleural tumor should be considered in the setting of pleuritic chest pain.  At least two small right lung nodules measuring up to 4 mm.  If this patient has a history of primary lung malignancy, comparison to prior images and/or attention on follow-up is suggested. Otherwise, follow-up CT chest is suggested in 12 months per Fleischner Society guidelines.  This recommendation follows the consensus statement: Guidelines for Management of Small Pulmonary Nodules Detected on CT Scans:  A Statement from the Fleischner Society as published in Radiology 2005; 237:395-400.  Original Report Authenticated By: Charline Bills, M.D.   Dg Chest Port 1 View  07/13/2012  *RADIOLOGY REPORT*  Clinical Data:  Left chest pain, COPD history of left lower lobectomy  PORTABLE CHEST - 1 VIEW  Comparison: Remote prior chest x-rays in 2003 and 2004 not currently available for review  Findings: There is blunting of the left cardiophrenic angle with associated left basilar opacity.  The lungs are negative for pulmonary edema.  The cardiac and mediastinal structures within normal limits.  No acute osseous abnormality.  IMPRESSION:  Left basilar opacity and blunting of the costophrenic angle.  Given the patient's history of prior left lower lobectomy, this may represent chronic post surgical changes and scarring.  However, a left sided pleural effusion with superimposed atelectasis and / or consolidation is difficult to exclude radiographically without recent prior imaging.  Consider further  evaluation with CT scan of the chest.  Original Report Authenticated By: HEATH     Assessment and Plan: I agree with the formulated Assessment and Plan with the following changes:  66 yr. 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 Malignant effusion. -Obtain pulmonary consultation for further recs. -Attempt to sample left pleural fluid by IR and send for culture, gram stain, LDH, protein, glucose, AFB, pH, cytology. -Start broad spectrum abx coverage with Vanc/Zosyn/Azithro after BC and ideally after pleural fluid obtained. -Follow BG closely given current possible infection. -Rest per resident physician note.  Jonah Blue, DO 8/2/201312:23 PM

## 2012-07-15 LAB — GLUCOSE, CAPILLARY: Glucose-Capillary: 60 mg/dL — ABNORMAL LOW (ref 70–99)

## 2012-07-15 MED ORDER — MOXIFLOXACIN HCL 400 MG PO TABS: 400.0000 mg | ORAL_TABLET | Freq: Every day | ORAL | Status: AC

## 2012-07-15 MED ORDER — INSULIN GLARGINE 100 UNIT/ML ~~LOC~~ SOLN: 40.0000 [IU] | Freq: Every day | SUBCUTANEOUS | Status: DC

## 2012-07-15 NOTE — Progress Notes (Signed)
Subjective: He reports feeling much better. Pleuritic chest pain has resolved. Denies any SOB and has improved coughing.   Objective: Vital signs in last 24 hours: Filed Vitals:   07/14/12 0500 07/14/12 1100 07/14/12 2100 07/15/12 0500  BP: 125/74 138/72 157/80 134/73  Pulse: 75 78 79 70  Temp: 97.9 F (36.6 C) 98.3 F (36.8 C) 98.1 F (36.7 C) 97.9 F (36.6 C)  TempSrc: Oral  Oral Oral  Resp:  19    Height:      Weight:      SpO2: 95% 92% 95% 99%   Weight change:   Intake/Output Summary (Last 24 hours) at 07/15/12 0721 Last data filed at 07/14/12 1200  Gross per 24 hour  Intake    120 ml  Output      0 ml  Net    120 ml  Physical Exam: Vitals reviewed. General: resting in bed, NAD HEENT: PERRL, EOMI, no scleral icterus Cardiac: RRR, no rubs, murmurs or gallops Pulm: clear to auscultation, absent breath sounds LLL, no wheezes, Abd: soft, nontender, nondistended, BS present Ext: warm and well perfused, no pedal edema, +2dp b/l Neuro: alert and oriented X3, cranial nerves II-XII grossly intact, strength and sensation to light touch equal in bilateral upper and lower extremities.  Lab Results: Basic Metabolic Panel:  Lab 07/14/12 1610 07/14/12 0605  NA 138 139  K 3.9 3.9  CL 100 100  CO2 28 30  GLUCOSE 126* 91  BUN 16 17  CREATININE 0.86 0.97  CALCIUM 9.4 9.6  MG -- --  PHOS -- --   Liver Function Tests:  Lab 07/14/12 0605 07/13/12 1450  AST 15 15  ALT 15 18  ALKPHOS 67 80  BILITOT 0.3 0.4  PROT 7.0 7.7  ALBUMIN 3.2* 3.6   CBC:  Lab 07/14/12 0819 07/14/12 0605 07/13/12 1450  WBC 8.6 9.0 --  NEUTROABS 4.9 -- 6.3  HGB 15.1 15.2 --  HCT 44.0 44.9 --  MCV 85.6 86.0 --  PLT 184 188 --   Cardiac Enzymes:  Lab 07/13/12 2101 07/13/12 1500  CKTOTAL 70 --  CKMB 2.5 --  CKMBINDEX -- --  TROPONINI <0.30 <0.30   CBG:  Lab 07/14/12 2149 07/14/12 1956 07/14/12 1648 07/14/12 1137 07/14/12 0750 07/14/12 0154  GLUCAP 126* 67* 162* 179* 66* 154*    Hemoglobin A1C:  Lab 07/13/12 2101  HGBA1C 9.6*   Fasting Lipid Panel:  Lab 07/14/12 0605  CHOL 169  HDL 46  LDLCALC 82  TRIG 205*  CHOLHDL 3.7  LDLDIRECT --   Studies/Results: Ct Chest W Contrast  07/14/2012  *RADIOLOGY REPORT*  Clinical Data: Cough, pleuritic chest pain, status post VATS on 09/2011  CT CHEST WITH CONTRAST  Technique:  Multidetector CT imaging of the chest was performed following the standard protocol during bolus administration of intravenous contrast.  Contrast: 80mL OMNIPAQUE IOHEXOL 300 MG/ML  SOLN  Comparison: Chest radiograph dated 07/13/2012  Findings: Prior left lower lobectomy with associated volume loss. Secondary pleural thickening/effusion at the left lung base, complex/loculated, including a vague area of enhancement within the pleural fluid (series 2/image 47).  Underlying moderate centrilobular emphysematous changes.  4 mm nodule in the right lower lobe (series 3/image 43).  Small suspected 3 mm nodule in the posterior right upper lobe (series 3/image 31).  No pneumothorax.  Although not optimally tailored for evaluation of the pulmonary arteries, no central pulmonary embolism is seen.  Visualized thyroid is heterogeneous/nodular.  The heart is normal in size.  No pericardial effusion.  Coronary atherosclerosis.  Mild atherosclerotic calcifications of the aortic arch.  Small mediastinal lymph nodes measuring up to 9 mm (series 2/image 25). No suspicious hilar or axillary lymphadenopathy.  The visualized upper abdomen is notable for hepatic steatosis.  Mild degenerative changes of the visualized thoracolumbar spine.  IMPRESSION: Prior left lower lobectomy with associated volume loss and postsurgical changes in the left hemithorax.  Complex pleural thickening/fluid with loculations and suspected vague area of enhancement.  While nonspecific, infection/empyema or pleural tumor should be considered in the setting of pleuritic chest pain.  At least two small right lung  nodules measuring up to 4 mm.  If this patient has a history of primary lung malignancy, comparison to prior images and/or attention on follow-up is suggested. Otherwise, follow-up CT chest is suggested in 12 months per Fleischner Society guidelines.  This recommendation follows the consensus statement: Guidelines for Management of Small Pulmonary Nodules Detected on CT Scans:  A Statement from the Fleischner Society as published in Radiology 2005; 237:395-400.  Original Report Authenticated By: Charline Bills, M.D.   [place] Chest  07/14/2012  *RADIOLOGY REPORT*  Clinical Data: Pleuritic chest pain,  History of VATS in 09/2011, possible left-sided effusion  CHEST ULTRASOUND  Comparison: CT scan with evidence of complex pleural thickening/fluid with loculations  Findings: The patient was initially set up as ultrasound-guided thoracentesis.  However, upon imaging with ultrasound, no significant free fluid or effusion is seen.  There are multiple thickened layers of dense tissue in the left lower chest cavity. However, no free fluid is seen to be accessible for thoracentesis. Thoracentesis was not performed.  IMPRESSION: Limited  left chest ultrasound reveals no significant effusion or fluid amendable to thoracentesis.  Read by Brayton El PA-C  Original Report Authenticated By: Judie Petit. Ruel Favors, M.D.   Dg Chest Port 1 View  07/13/2012  *RADIOLOGY REPORT*  Clinical Data:  Left chest pain, COPD history of left lower lobectomy  PORTABLE CHEST - 1 VIEW  Comparison: Remote prior chest x-rays in 2003 and 2004 not currently available for review  Findings: There is blunting of the left cardiophrenic angle with associated left basilar opacity.  The lungs are negative for pulmonary edema.  The cardiac and mediastinal structures within normal limits.  No acute osseous abnormality.  IMPRESSION:  Left basilar opacity and blunting of the costophrenic angle.  Given the patient's history of prior left lower lobectomy, this may  represent chronic post surgical changes and scarring.  However, a left sided pleural effusion with superimposed atelectasis and / or consolidation is difficult to exclude radiographically without recent prior imaging.  Consider further evaluation with CT scan of the chest.  Original Report Authenticated By: Vilma Prader   Medications: I have reviewed the patient's current medications. Scheduled Meds:    . amitriptyline  50 mg Oral QHS  . aspirin EC  81 mg Oral Daily  . budesonide-formoterol  2 puff Inhalation BID  . chlorthalidone  25 mg Oral Daily  . gabapentin  1,200 mg Oral BID  . insulin aspart  0-25 Units Subcutaneous Q1200  . insulin aspart  0-31 Units Subcutaneous BID WC  . insulin aspart  0-5 Units Subcutaneous QHS  . insulin glargine  45 Units Subcutaneous QHS  . losartan  100 mg Oral Daily  . moxifloxacin  400 mg Intravenous Q24H  . simvastatin  40 mg Oral q1800  . DISCONTD: gabapentin  1,200 mg Oral BID  . DISCONTD: heparin  5,000 Units Subcutaneous Q8H  Continuous Infusions:  PRN Meds:.acetaminophen, albuterol, iohexol, ipratropium, morphine injection  Assessment/Plan: 66 year old male who presented on 8/1 with 1 day h/o fatigue and productive cough. Also reported non-radiating left sided chest pain, as well as chills and fever. He was admitted to the med-surg ward. Diagnostic evaluation demonstrated left loculated effusion as well as post-op lobectomy changes.   #Possible CAP in the setting of prior lobectomy in 10/12 after probable lung abscess: Patient had LLL lobectomy for the consolidated lung mass that was was suspicious for malignancy but was later thought to be infectious etiology.  He presents with pleuritic chest pain and CT chest is suspicious for possible infiltrate and shows changes consistent with his Lobectomy. He states that his chest pain has resolved completely, reports improvement in his cough and denies any SOB. Appreciate pulmonary inputs! Sputum cultures could  not be obtained. Blood cultures are pending.  - Continue Avelox for 6 more days.  -  f/u outpatient with pulmonary  on August 23rd, 2013 and repeat CT scan in 3-6 months -  f/u with Dr. Marchelle Gearing in September 2013      #Diabetes mellitus (07/13/2012)--A1c: 9.6. Patient reports medication non compliance. His morning blood sugars are usually running low . His morning CBG was 66 this AM.  -decrease  -monitor BG  #Hypertension (07/13/2012): BP stable.   -Continue Losartan   #DVT Prophylaxis: SCDs in place  # Dispo: d/c home today.    LOS: 2 days   Harlan Ervine 07/15/2012, 7:21 AM

## 2012-07-15 NOTE — Progress Notes (Signed)
Discharge Note: Pt is alert and oriented, VS are stable, denies CP.  Telemetry & IV discontinued.  Discharge instructions reviewed with patient, pt verbalizes understanding.  Wheelchair transportation provided, all belongings with patient  

## 2012-07-17 NOTE — Discharge Summary (Signed)
INTERNAL MEDICINE TEACHING SERVICE Attending Note  Date: 07/17/2012  Patient name: Anthony Petersen  Medical record number: [id number]  Date of birth: Apr 30, 1946    This patient has been discussed with the house staff. Please see their note for complete details. I concur with their findings and plan.   Jonah Blue, DO  07/17/2012, 2:07 PM

## 2012-07-18 ENCOUNTER — Telehealth: Payer: Self-pay | Admitting: Internal Medicine

## 2012-07-18 NOTE — Telephone Encounter (Signed)
No need for message. °

## 2012-07-20 LAB — CULTURE, BLOOD (ROUTINE X 2)
Culture: NO GROWTH
Culture: NO GROWTH

## 2012-08-04 ENCOUNTER — Institutional Professional Consult (permissible substitution): Payer: PRIVATE HEALTH INSURANCE | Admitting: Internal Medicine

## 2012-08-07 ENCOUNTER — Ambulatory Visit (INDEPENDENT_AMBULATORY_CARE_PROVIDER_SITE_OTHER): Payer: Medicare Other | Admitting: Internal Medicine

## 2012-08-07 ENCOUNTER — Encounter: Payer: Self-pay | Admitting: Internal Medicine

## 2012-08-07 VITALS — BP 122/86 | HR 70 | Temp 98.1°F | Ht 71.0 in | Wt 224.8 lb

## 2012-08-07 DIAGNOSIS — R0600 Dyspnea, unspecified: Secondary | ICD-10-CM

## 2012-08-07 DIAGNOSIS — Z902 Acquired absence of lung [part of]: Secondary | ICD-10-CM

## 2012-08-07 DIAGNOSIS — R918 Other nonspecific abnormal finding of lung field: Secondary | ICD-10-CM

## 2012-08-07 DIAGNOSIS — R0989 Other specified symptoms and signs involving the circulatory and respiratory systems: Secondary | ICD-10-CM

## 2012-08-07 DIAGNOSIS — Z9889 Other specified postprocedural states: Secondary | ICD-10-CM

## 2012-08-07 DIAGNOSIS — J9610 Chronic respiratory failure, unspecified whether with hypoxia or hypercapnia: Secondary | ICD-10-CM

## 2012-08-07 NOTE — Assessment & Plan Note (Signed)
At least two small right lung nodules measuring up to 4 mm. If  this patient has a history of primary lung malignancy, comparison  to prior images and/or attention on follow-up is suggested.  Otherwise, follow-up CT chest is suggested in 12 months    Very poor operative candidate based on present fxn > place in tickle file for recall in one year

## 2012-08-07 NOTE — Assessment & Plan Note (Addendum)
Symptoms are markedly disproportionate to objective findings and not clear this is all a  lung problem but pt does appear to have difficult airway management issues.    DDX of  difficult airways managment all start with A and  include Adherence, Ace Inhibitors, Acid Reflux, Active Sinus Disease, Alpha 1 Antitripsin deficiency, Anxiety masquerading as Airways dz,  ABPA,  allergy(esp in young), Aspiration (esp in elderly), Adverse effects of DPI,  Active smokers, plus two Bs  = Bronchiectasis and Beta blocker use..and one C= CHF  Adherence is always the initial "prime suspect" and is a multilayered concern that requires a "trust but verify" approach in every patient - starting with knowing how to use medications, especially inhalers, correctly, keeping up with refills and understanding the fundamental difference between maintenance and prns vs those medications only taken for a very short course and then stopped and not refilled. The proper method of use, as well as anticipated side effects, of a metered-dose inhaler are discussed and demonstrated to the patient. Improved effectiveness after extensive coaching during this visit to a level of approximately  75% so continue symbicort and spiriva pending f/u pft's

## 2012-08-07 NOTE — Assessment & Plan Note (Signed)
-   08/07/2012  Walked RA  2 laps @ 185 ft each stopped due to  desat to 83%  corrected on 3lpm  Extremely poor insight into 02 needs and "variability" to doe which may relate completely to whether he remembers to use 02 with exertion.   For now clearly needs the 02 and more consistent exercise on 02

## 2012-08-07 NOTE — Progress Notes (Signed)
  Subjective:    Patient ID: Anthony Petersen, male    DOB: [birth year] MRN: [id number]  HPI  66 yowm quit smoking age 66 with LLL removed Sept 2012 HP ? Dx? Lung abscess with residual fluid L lung eval by [facility] > rehab recommended but no f/u.  08/07/2012 1st pulmonary eval cc cough x 3 months gradually esp every night after supper before lie down with half tsp clear mucus.  Once uses pa sabacort does better and Sleeping ok without nocturnal  or early am exacerbation  of respiratory  c/o's or need for noct saba. Also denies any obvious fluctuation of symptoms with weather or environmental changes or other aggravating or alleviating factors except as outlined above   Variable doe sometimes no limit even when not wearing 02 including raking leaves.  Mailbox and back no problem s 02 on spiriva in am and symbicort at lunch and sometimes an evening dose.  Better from rehab and lost ground ie can't walk more than 5 min s sob (off 02).   Review of Systems  Constitutional: Negative.   HENT: Positive for congestion, rhinorrhea and postnasal drip.   Eyes: Negative.   Respiratory: Positive for cough, shortness of breath and wheezing.   Cardiovascular: Negative.   Gastrointestinal: Negative.   Genitourinary: Negative.   Musculoskeletal: Negative.   Skin: Negative.   Hematological: Bruises/bleeds easily.  Psychiatric/Behavioral: Positive for dysphoric mood. The patient is nervous/anxious.        Objective:   Physical Exam  amb wm nad with unusual affect   Wt Readings from Last 3 Encounters:  08/07/12 224 lb 12.8 oz (101.969 kg)  07/14/12 221 lb 11.2 oz (100.562 kg)  HEENT mild turbinate edema.  Oropharynx no thrush or excess pnd or cobblestoning.  No JVD or cervical adenopathy. Min accessory muscle hypertrophy. Trachea midline, nl thryroid. Chest was hyperinflated by percussion with diminished breath sounds and moderate increased exp time without wheeze. Hoover sign positive at end inspiration.  Regular rate and rhythm without murmur gallop or rub or increase P2 or edema.  Abd: no hsm, nl excursion. Ext warm without cyanosis or clubbing.     U/s L chest 07/14/12 Limited left chest ultrasound reveals no significant effusion or  fluid amendable to thoracentesis.     Assessment & Plan:

## 2012-08-07 NOTE — Assessment & Plan Note (Signed)
September 2012  - Path requested 08/07/2012   - u/s L chest 07/14/12 > no sign residual effusion

## 2012-08-07 NOTE — Patient Instructions (Addendum)
Symbicort 160 Take 2 puffs first thing in am and then another 2 puffs about 12 hours later.   Continue spiriva for now  Always wear 02 with cpap then during the day wear it with activity that is more intense than mailbox @ 3lpm   Please see patient coordinator before you leave today  to schedule records release from Dr Deboraha Sprang and [facility]  Please schedule a follow up office visit in 4 weeks, sooner if needed

## 2012-08-08 ENCOUNTER — Telehealth: Payer: Self-pay | Admitting: Internal Medicine

## 2012-08-08 NOTE — Telephone Encounter (Signed)
Forward 37 pages from [facility] to Dr. Sandrea Hughs for review on 08-08-12 ym

## 2012-08-08 NOTE — Telephone Encounter (Signed)
Forward  44 apges from [facility] to Dr. Sandrea Hughs for review on 08-08-12 ym

## 2012-08-09 ENCOUNTER — Encounter: Payer: Self-pay | Admitting: Internal Medicine

## 2012-08-11 ENCOUNTER — Encounter: Payer: Self-pay | Admitting: Internal Medicine

## 2012-08-31 ENCOUNTER — Institutional Professional Consult (permissible substitution): Payer: PRIVATE HEALTH INSURANCE | Admitting: Internal Medicine

## 2012-09-04 ENCOUNTER — Ambulatory Visit (INDEPENDENT_AMBULATORY_CARE_PROVIDER_SITE_OTHER): Payer: Medicare Other | Admitting: Internal Medicine

## 2012-09-04 ENCOUNTER — Encounter: Payer: Self-pay | Admitting: Internal Medicine

## 2012-09-04 VITALS — BP 130/64 | HR 67 | Temp 97.9°F | Ht 71.0 in | Wt 228.8 lb

## 2012-09-04 DIAGNOSIS — J449 Chronic obstructive pulmonary disease, unspecified: Secondary | ICD-10-CM | POA: Insufficient documentation

## 2012-09-04 DIAGNOSIS — J9610 Chronic respiratory failure, unspecified whether with hypoxia or hypercapnia: Secondary | ICD-10-CM

## 2012-09-04 MED ORDER — BUDESONIDE-FORMOTEROL FUMARATE 160-4.5 MCG/ACT IN AERO: 2.0000 | INHALATION_SPRAY | Freq: Two times a day (BID) | RESPIRATORY_TRACT | Status: AC

## 2012-09-04 NOTE — Patient Instructions (Addendum)
Wait at least a week and they try off spiriva to see what difference if any it makes with your exercise tolerance or cough (think of this like high octane fuel)  Weight control is simply a matter of calorie balance which needs to be tilted in your favor by eating less and exercising more.  To get the most out of exercise, you need to be continuously aware that you are short of breath, but never out of breath, for 30 minutes daily while wearing oxygen. As you improve, it will actually be easier for you to do the same amount of exercise  in  30 minutes so always push to the level where you are short of breath.   Work on inhaler technique:  relax and gently blow all the way out then take a nice smooth deep breath back in, triggering the inhaler at same time you start breathing in.  Hold for up to 5 seconds if you can.  Rinse and gargle with water when done   If your mouth or throat starts to bother you,   I suggest you time the inhaler to your dental care and after using the inhaler(s) brush teeth and tongue with a baking soda containing toothpaste and when you rinse this out, gargle with it first to see if this helps your mouth and throat.     For cough try delsym cough syrup and if cough still bothers you I recommend a trial of Try prilosec 20mg   Take 30-60 min before first meal of the day and Pepcid 20 mg one bedtime until cough is completely gone for at least a week without the need for cough suppression  Please schedule a follow up office visit in 6 weeks, call sooner if needed with PFT's

## 2012-09-04 NOTE — Progress Notes (Signed)
  Subjective:    Patient ID: Anthony Petersen, male    DOB: [birth year]  MRN: [id number]  HPI  16 yowm quit smoking age 66 with LLL removed Sept 2012 HP ? Dx? Lung abscess with residual fluid L lung eval by [facility] > rehab recommended but no f/u.  08/07/2012 1st pulmonary eval cc cough x 3 months gradually esp every night after supper before lie down with half tsp clear mucus.  Once uses pm symbicort  does better and Sleeping ok without nocturnal  or early am exacerbation  of respiratory  c/o's or need for noct saba.  rec Symbicort 160 Take 2 puffs first thing in am and then another 2 puffs about 12 hours later.  Continue spiriva for now Always wear 02 with cpap then during the day wear it with activity that is more intense than mailbox @ 3lpm  Please see patient coordinator before you leave today  to schedule records release from Dr Deboraha Sprang and [facility]  Variable doe sometimes no limit even when not wearing 02 including raking leaves.  Mailbox and back no problem s 02 on spiriva in am and symbicort at lunch and sometimes an evening dose.  Better from rehab and lost ground ie can't walk more than 5 min s sob (off 02).   09/04/2012 f/u ov/Octavie Westerhold cc overall better,  Going to y to walk indoor track twice weekly x 2 minutes and stop off 02 "don't want to get addicted" gaining wt.  Cough is variably better, still on symbicort and spiriva. No   cp or chest tightness, subjective wheeze overt sinus or hb symptoms. No unusual exp hx    Sleeping ok without nocturnal  or early am exacerbation  of respiratory  c/o's or need for noct saba. Also denies any obvious fluctuation of symptoms with weather or environmental changes or other aggravating or alleviating factors except as outlined above   ROS  The following are not active complaints unless bolded sore throat, dysphagia, dental problems, itching, sneezing,  nasal congestion or excess/ purulent secretions, ear ache,   fever, chills, sweats, unintended wt loss,  pleuritic or exertional cp, hemoptysis,  orthopnea pnd or leg swelling, presyncope, palpitations, heartburn, abdominal pain, anorexia, nausea, vomiting, diarrhea  or change in bowel or urinary habits, change in stools or urine, dysuria,hematuria,  rash, arthralgias, visual complaints, headache, numbness weakness or ataxia or problems with walking or coordination,  change in mood/affect or memory.                Objective:   Physical Exam  amb wm nad with unusual affect  wt 228 09/04/2012  Wt Readings from Last 3 Encounters:  08/07/12 224 lb 12.8 oz (101.969 kg)  07/14/12 221 lb 11.2 oz (100.562 kg)  HEENT mild turbinate edema.  Oropharynx no thrush or excess pnd or cobblestoning.  No JVD or cervical adenopathy. Min accessory muscle hypertrophy. Trachea midline, nl thryroid. Chest was hyperinflated by percussion with diminished breath sounds and moderate increased exp time without wheeze. Hoover sign positive at end inspiration. Regular rate and rhythm without murmur gallop or rub or increase P2 or edema.  Abd: no hsm, nl excursion. Ext warm without cyanosis or clubbing.     U/s L chest 07/14/12 Limited left chest ultrasound reveals no significant effusion or  fluid amendable to thoracentesis.     Assessment & Plan:

## 2012-09-04 NOTE — Assessment & Plan Note (Signed)
Not following rec to use 02 with exertion  See instructions for specific recommendations which were reviewed directly with the patient who was given a copy with highlighter outlining the key components.

## 2012-09-04 NOTE — Assessment & Plan Note (Signed)
-   Spirometry 12/17/11 (outside digital 1.23 FEV1   - HFA 75% 09/04/2012    - Spirometry 09/04/2012 FEV1  1.53 (41%) ratio 49  Variable fev1 and symptoms plus cough strongly suggests more asthma than emphysematous phenotype so will try max rx with symbicort and off spiriva on a trial basis  The proper method of use, as well as anticipated side effects, of a metered-dose inhaler are discussed and demonstrated to the patient. Improved effectiveness after extensive coaching during this visit to a level of approximately  75%    Each maintenance medication was reviewed in detail including most importantly the difference between maintenance and as needed and under what circumstances the prns are to be used.  Please see instructions for details which were reviewed in writing and the patient given a copy.

## 2012-10-16 ENCOUNTER — Ambulatory Visit (INDEPENDENT_AMBULATORY_CARE_PROVIDER_SITE_OTHER): Payer: Medicare Other | Admitting: Internal Medicine

## 2012-10-16 ENCOUNTER — Ambulatory Visit: Payer: Medicare Other | Admitting: Internal Medicine

## 2012-10-16 DIAGNOSIS — J449 Chronic obstructive pulmonary disease, unspecified: Secondary | ICD-10-CM

## 2012-10-16 LAB — PULMONARY FUNCTION TEST

## 2012-10-16 NOTE — Progress Notes (Signed)
PFT done today. 

## 2012-10-17 ENCOUNTER — Encounter: Payer: Self-pay | Admitting: Internal Medicine

## 2012-10-18 ENCOUNTER — Ambulatory Visit (INDEPENDENT_AMBULATORY_CARE_PROVIDER_SITE_OTHER): Payer: Medicare Other | Admitting: Internal Medicine

## 2012-10-18 ENCOUNTER — Encounter: Payer: Self-pay | Admitting: Internal Medicine

## 2012-10-18 VITALS — BP 138/76 | HR 99 | Temp 98.0°F | Ht 71.0 in | Wt 213.4 lb

## 2012-10-18 DIAGNOSIS — J9610 Chronic respiratory failure, unspecified whether with hypoxia or hypercapnia: Secondary | ICD-10-CM

## 2012-10-18 DIAGNOSIS — J449 Chronic obstructive pulmonary disease, unspecified: Secondary | ICD-10-CM

## 2012-10-18 MED ORDER — LEVALBUTEROL TARTRATE 45 MCG/ACT IN AERO: 1.0000 | INHALATION_SPRAY | RESPIRATORY_TRACT | Status: DC | PRN

## 2012-10-18 NOTE — Progress Notes (Signed)
Subjective:    Patient ID: Anthony Petersen, male    DOB: Sep 13, 1946  MRN: [id number]  HPI  66 yowm quit smoking age 66 with LLL removed Sept 2012 HP ? Dx? Lung abscess with residual fluid L lung eval by [facility] > rehab recommended but no f/u.  08/07/2012 1st pulmonary eval cc cough x 3 months gradually esp every night after supper before lie down with half tsp clear mucus.  Once uses pm symbicort  does better and Sleeping ok without nocturnal  or early am exacerbation  of respiratory  c/o's or need for noct saba.  rec Symbicort 160 Take 2 puffs first thing in am and then another 2 puffs about 12 hours later.  Continue spiriva for now Always wear 02 with cpap then during the day wear it with activity that is more intense than mailbox @ 3lpm  Please see patient coordinator before you leave today  to schedule records release from Dr Deboraha Sprang and [facility]  Variable doe sometimes no limit even when not wearing 02 including raking leaves.  Mailbox and back no problem s 02 on spiriva in am and symbicort at lunch and sometimes an evening dose.  Better from rehab and lost ground ie can't walk more than 5 min s sob (off 02).   09/04/2012 f/u ov/Anthony Petersen cc overall better,  Going to y to walk indoor track twice weekly x 2 minutes and stop off 02 "don't want to get addicted" gaining wt.  Cough is variably better, still on symbicort and spiriva.   rec Wait at least a week and they try off spiriva to see what difference if any it makes with your exercise tolerance or cough (think of this like high octane fuel) Weight control   Work on inhaler technique:     For cough try delsym cough syrup and if cough still bothers you I recommend a trial of Try prilosec 20mg   Take 30-60 min before first meal of the day and Pepcid 20 mg one bedtime until cough is completely gone for at least a week without the need for cough suppression Please schedule a follow up office visit in 6 weeks, call sooner if needed with  PFT's  10/18/2012 f/u ov/Anthony Petersen cc  Improving sob despite stopping the spiriva and continuing on symbicort 160 2bid .  No obvious daytime variabilty or assoc chronic cough or cp or chest tightness, subjective wheeze overt sinus or hb symptoms. No unusual exp hx or h/o childhood pna/ asthma or premature birth to his knowledge. No longer needing cough meds  Sleeping ok without nocturnal  or early am exacerbation  of respiratory  c/o's or need for noct saba. Also denies any obvious fluctuation of symptoms with weather or environmental changes or other aggravating or alleviating factors except as outlined above   ROS  The following are not active complaints unless bolded sore throat, dysphagia, dental problems, itching, sneezing,  nasal congestion or excess/ purulent secretions, ear ache,   fever, chills, sweats, unintended wt loss, pleuritic or exertional cp, hemoptysis,  orthopnea pnd or leg swelling, presyncope, palpitations, heartburn, abdominal pain, anorexia, nausea, vomiting, diarrhea  or change in bowel or urinary habits, change in stools or urine, dysuria,hematuria,  rash, arthralgias, visual complaints, headache, numbness weakness or ataxia or problems with walking or coordination,  change in mood/affect or memory.                Objective:   Physical Exam  amb wm nad with unusual affect  wt 228 09/04/2012 > 10/18/2012  213 Wt Readings from Last 3 Encounters:  08/07/12 224 lb 12.8 oz (101.969 kg)  07/14/12 221 lb 11.2 oz (100.562 kg)  HEENT mild turbinate edema.  Oropharynx no thrush or excess pnd or cobblestoning.  No JVD or cervical adenopathy. Min accessory muscle hypertrophy. Trachea midline, nl thryroid. Chest was hyperinflated by percussion with diminished breath sounds and moderate increased exp time without wheeze. Hoover sign positive at end inspiration. Regular rate and rhythm without murmur gallop or rub or increase P2 or edema.  Abd: no hsm, nl excursion. Ext warm without  cyanosis or clubbing.     U/s L chest 07/14/12 Limited left chest ultrasound reveals no significant effusion or  fluid amendable to thoracentesis.     Assessment & Plan:

## 2012-10-18 NOTE — Patient Instructions (Addendum)
Work maintaining perfect inhaler technique:  relax and gently blow all the way out then take a nice smooth deep breath back in, triggering the inhaler at same time you start breathing in.  Hold for up to 5 seconds if you can.  Rinse and gargle with water when done   If your mouth or throat starts to bother you,   I suggest you time the inhaler to your dental care and after using the inhaler(s) brush teeth and tongue with a baking soda containing toothpaste and when you rinse this out, gargle with it first to see if this helps your mouth and throat.     Please schedule a follow up visit in 6 months but call sooner if needed

## 2012-10-19 NOTE — Assessment & Plan Note (Signed)
-   -   08/07/2012  Walked RA  2 laps @ 185 ft each stopped due to  desat to 83%  corrected on 3lpm  - 02 at 3lpm,  Uses on 3lpm with activities outside the house  Adequate control on present rx, reviewed rx

## 2012-10-19 NOTE — Assessment & Plan Note (Signed)
-   Spirometry 12/17/11 (outside digital) 1.23 FEV1   - HFA 75% 09/04/2012    - Spirometry 09/04/2012 FEV1  1.53 (41%) ratio 49   - PFT's 10/16/12 FEV1  1.54 (48%) ratio 45 and 15% better p B2, DLC0 55% corrects to 73%  GOLD III with sign ab component adequately treated just with symbicort at this point.    Each maintenance medication was reviewed in detail including most importantly the difference between maintenance and as needed and under what circumstances the prns are to be used.  Please see instructions for details which were reviewed in writing and the patient given a copy.

## 2012-10-27 ENCOUNTER — Encounter: Payer: Self-pay | Admitting: Internal Medicine

## 2013-03-10 ENCOUNTER — Emergency Department (HOSPITAL_COMMUNITY)
Admission: EM | Admit: 2013-03-10 | Discharge: 2013-03-10 | Disposition: A | Discharge status: Home / Self Care | Payer: Medicare Other | Attending: Emergency Medicine | Admitting: Emergency Medicine

## 2013-03-10 ENCOUNTER — Emergency Department (HOSPITAL_COMMUNITY): Payer: Medicare Other

## 2013-03-10 ENCOUNTER — Encounter (HOSPITAL_COMMUNITY): Payer: Self-pay | Admitting: Cardiology

## 2013-03-10 DIAGNOSIS — Z87891 Personal history of nicotine dependence: Secondary | ICD-10-CM | POA: Insufficient documentation

## 2013-03-10 DIAGNOSIS — Z872 Personal history of diseases of the skin and subcutaneous tissue: Secondary | ICD-10-CM | POA: Insufficient documentation

## 2013-03-10 DIAGNOSIS — Z8669 Personal history of other diseases of the nervous system and sense organs: Secondary | ICD-10-CM | POA: Insufficient documentation

## 2013-03-10 DIAGNOSIS — I2510 Atherosclerotic heart disease of native coronary artery without angina pectoris: Secondary | ICD-10-CM | POA: Insufficient documentation

## 2013-03-10 DIAGNOSIS — E785 Hyperlipidemia, unspecified: Secondary | ICD-10-CM | POA: Insufficient documentation

## 2013-03-10 DIAGNOSIS — E1169 Type 2 diabetes mellitus with other specified complication: Secondary | ICD-10-CM | POA: Insufficient documentation

## 2013-03-10 DIAGNOSIS — Z8719 Personal history of other diseases of the digestive system: Secondary | ICD-10-CM | POA: Insufficient documentation

## 2013-03-10 DIAGNOSIS — I129 Hypertensive chronic kidney disease with stage 1 through stage 4 chronic kidney disease, or unspecified chronic kidney disease: Secondary | ICD-10-CM | POA: Insufficient documentation

## 2013-03-10 DIAGNOSIS — Z8679 Personal history of other diseases of the circulatory system: Secondary | ICD-10-CM | POA: Insufficient documentation

## 2013-03-10 DIAGNOSIS — Z8701 Personal history of pneumonia (recurrent): Secondary | ICD-10-CM | POA: Insufficient documentation

## 2013-03-10 DIAGNOSIS — Z8709 Personal history of other diseases of the respiratory system: Secondary | ICD-10-CM | POA: Insufficient documentation

## 2013-03-10 DIAGNOSIS — J449 Chronic obstructive pulmonary disease, unspecified: Secondary | ICD-10-CM | POA: Insufficient documentation

## 2013-03-10 DIAGNOSIS — J4489 Other specified chronic obstructive pulmonary disease: Secondary | ICD-10-CM | POA: Insufficient documentation

## 2013-03-10 DIAGNOSIS — Z7982 Long term (current) use of aspirin: Secondary | ICD-10-CM | POA: Insufficient documentation

## 2013-03-10 DIAGNOSIS — E1349 Other specified diabetes mellitus with other diabetic neurological complication: Secondary | ICD-10-CM | POA: Insufficient documentation

## 2013-03-10 DIAGNOSIS — Z79899 Other long term (current) drug therapy: Secondary | ICD-10-CM | POA: Insufficient documentation

## 2013-03-10 DIAGNOSIS — Z9861 Coronary angioplasty status: Secondary | ICD-10-CM | POA: Insufficient documentation

## 2013-03-10 DIAGNOSIS — Z8639 Personal history of other endocrine, nutritional and metabolic disease: Secondary | ICD-10-CM | POA: Insufficient documentation

## 2013-03-10 DIAGNOSIS — Z862 Personal history of diseases of the blood and blood-forming organs and certain disorders involving the immune mechanism: Secondary | ICD-10-CM | POA: Insufficient documentation

## 2013-03-10 DIAGNOSIS — Z794 Long term (current) use of insulin: Secondary | ICD-10-CM | POA: Insufficient documentation

## 2013-03-10 DIAGNOSIS — E11621 Type 2 diabetes mellitus with foot ulcer: Secondary | ICD-10-CM

## 2013-03-10 DIAGNOSIS — N189 Chronic kidney disease, unspecified: Secondary | ICD-10-CM | POA: Insufficient documentation

## 2013-03-10 DIAGNOSIS — L97509 Non-pressure chronic ulcer of other part of unspecified foot with unspecified severity: Secondary | ICD-10-CM | POA: Insufficient documentation

## 2013-03-10 DIAGNOSIS — Z8739 Personal history of other diseases of the musculoskeletal system and connective tissue: Secondary | ICD-10-CM | POA: Insufficient documentation

## 2013-03-10 DIAGNOSIS — E1142 Type 2 diabetes mellitus with diabetic polyneuropathy: Secondary | ICD-10-CM | POA: Insufficient documentation

## 2013-03-10 LAB — CBC WITH DIFFERENTIAL/PLATELET
Basophils Absolute: 0 10*3/uL (ref 0.0–0.1)
Lymphocytes Relative: 19 % (ref 12–46)
Lymphs Abs: 1.7 10*3/uL (ref 0.7–4.0)
Neutro Abs: 6.5 10*3/uL (ref 1.7–7.7)
Neutrophils Relative %: 72 % (ref 43–77)
Platelets: 187 10*3/uL (ref 150–400)
RBC: 5.56 MIL/uL (ref 4.22–5.81)
RDW: 13 % (ref 11.5–15.5)
WBC: 9.1 10*3/uL (ref 4.0–10.5)

## 2013-03-10 LAB — BASIC METABOLIC PANEL
CO2: 28 mEq/L (ref 19–32)
Calcium: 10.1 mg/dL (ref 8.4–10.5)
Chloride: 99 mEq/L (ref 96–112)
Glucose, Bld: 251 mg/dL — ABNORMAL HIGH (ref 70–99)
Potassium: 4.3 mEq/L (ref 3.5–5.1)
Sodium: 136 mEq/L (ref 135–145)

## 2013-03-10 LAB — GLUCOSE, CAPILLARY: Glucose-Capillary: 129 mg/dL — ABNORMAL HIGH (ref 70–99)

## 2013-03-10 MED ORDER — CLINDAMYCIN HCL 150 MG PO CAPS: 450.0000 mg | ORAL_CAPSULE | Freq: Three times a day (TID) | ORAL | Status: AC

## 2013-03-10 MED ORDER — CLINDAMYCIN PHOSPHATE 900 MG/50ML IV SOLN
900.0000 mg | Freq: Once | INTRAVENOUS | Status: AC
  Administered 2013-03-10: 900 mg via INTRAVENOUS
  Filled 2013-03-10: qty 50

## 2013-03-10 NOTE — ED Notes (Signed)
Pt reports last night he noticed that on his left foot his pinky toe has pushed against the next toe and caused and small ulcer. Denies any pain at this time. Pt with some redness noted to the toes and top of foot. States he is a diabetic and recently had a Hgba1c done that was around 12. Reports poor circulation in his lower extremities.

## 2013-03-10 NOTE — ED Provider Notes (Signed)
History     CSN: [id number]  Arrival date & time 03/10/13  1354   First MD Initiated Contact with Patient 03/10/13 1637      Chief Complaint  Patient presents with  . Foot Pain   HPI  67 y/o male with history of DM, Peripheral neuropathy, HTN, HLD, Peripheral vascular disease who presents with cc of left foot wound. The patient states that today he noticed a wound on his left foot. He states he noticed the wound on the lateral aspect of his 4th digit and the medial aspect of the 5th digit. He has associated redness. He denies fevers or chills. He denies pain. He has been wearing a different shoe than he typically wears.   Past Medical History  Diagnosis Date  . Abdominal pain, unspecified site   . Acute gouty arthropathy   . Acute sinusitis, unspecified   . Painful respiration   . Anorexia   . Essential hypertension, benign     Patient is currently not taking his blood pressure medications as prescribed.  . Corns and callosities   . Cellulitis and abscess of unspecified site   . Chronic airway obstruction, not elsewhere classified     2x30 quit 90 ? restriction/2x25 quit 90  . Pneumonia, organism unspecified     left lower lobe  . Unspecified constipation   . Cough   . Hemoptysis   . Peripheral autonomic neuropathy in disorders classified elsewhere   . Polyneuropathy in diabetes(357.2)   . Disturbances of sensation of smell and taste   . Dizziness and giddiness   . Contracture of palmar fascia   . Unspecified essential hypertension     Hypertension Inadequately controlled  . Other malaise and fatigue   . Pain in limb     foot pain(soft tissue)  . Heartburn   . Hyperlipidemia   . Hypoxemia   . Pain in joint, upper arm     localized in the elbow  . Stiffness of joint, not elsewhere classified, pelvic region and thigh     hip  . Unspecified pleural effusion     left-sided  . Other and unspecified disc disorder of lumbar region   . Swelling, mass, or lump in chest       LLL superior segment peripheral ?cavitations, pl changes  . Proteinuria   . Obstructive sleep apnea of adult   . Neuropathy due to secondary diabetes   . PAD (peripheral artery disease)   . Diabetes mellitus   . Diabetes mellitus associated with receptor abnormality   . Arrhythmia     irregular heart beat  . Coronary atherosclerosis of unspecified type of vessel, native or graft   . Chronic kidney disease     early stages of renal failure  . BOOP (bronchiolitis obliterans with organizing pneumonia)     history of  . Emphysema     Past Surgical History  Procedure Laterality Date  . Lung removal, partial  left lower lobe  . Cardiac catheterization      stent (back side of heart)  . Testicle surgery    . Vascular surgery  2012    stent r leg  . Arm debridement  2010    right arm  . Lumbar disc surgery  2001    Family History  Problem Relation Age of Onset  . Diabetes Mother   . Hypertension Mother   . Hyperlipidemia Mother   . COPD Father   . Fibromyalgia Sister     History  Substance Use Topics  . Smoking status: Former Smoker -- 2.00 packs/day for 15 years    Quit date: 12/13/1988  . Smokeless tobacco: Not on file  . Alcohol Use: No     Comment: quit drinking 25 years ago      Review of Systems  Constitutional: Negative for fever and chills.  Gastrointestinal: Negative for nausea and vomiting.  Skin: Positive for wound.  All other systems reviewed and are negative.    Allergies  Prednisone  Home Medications   Current Outpatient Rx  Name  Route  Sig  Dispense  Refill  . amitriptyline (ELAVIL) 50 MG tablet   Oral   Take 50 mg by mouth at bedtime.         Marland Kitchen aspirin EC 81 MG tablet   Oral   Take 81 mg by mouth daily.         . budesonide-formoterol (SYMBICORT) 160-4.5 MCG/ACT inhaler   Inhalation   Inhale 2 puffs into the lungs 2 (two) times daily.   1 Inhaler   11   . gabapentin (NEURONTIN) 600 MG tablet   Oral   Take 1,200 mg by  mouth 2 (two) times daily.          . insulin glargine (LANTUS) 100 UNIT/ML injection   Subcutaneous   Inject 45 Units into the skin at bedtime.         . insulin lispro (HUMALOG) 100 UNIT/ML injection   Subcutaneous   Inject 0-31 Units into the skin 4 (four) times daily -  with meals and at bedtime. 70-90 = 9 units with breakfast, 5 units with lunch, 9 units with supper 91-130 (Base Dose) = 15 units with breakfast, 9 units with lunch, 15 units with supper 131-150 = 17 units with breakfast, 11 units with lunch, 17 units with supper 151-200 = 19 units with breakfast, 13 units with lunch, 19 units with supper 201-250 = 21 units with breakfast, 15 units with lunch, 21 units with supper 251-300 = 23 units with breakfast, 17 units with lunch, 23 units with supper, 1 unit at nighttime.  301-350 = 25 units with breakfast, 19 units with lunch, 25 units with supper, 2 units at nighttime.  351-400 = 27 units with breakfast, 21 units with lunch, 27 units with supper, 3 units at nighttime.  401-450 = 29 units with breakfast, 23 units with lunch, 29 units with supper, 4 units at nighttime.  >450 = 31 units with breakfast, 25 units with lunch, 31 units with supper, 5 units at nighttime.         . ondansetron (ZOFRAN-ODT) 4 MG disintegrating tablet   Oral   Take 4 mg by mouth every 8 (eight) hours as needed for nausea.         . simvastatin (ZOCOR) 10 MG tablet   Oral   Take 10 mg by mouth at bedtime.         . valsartan-hydrochlorothiazide (DIOVAN-HCT) 160-12.5 MG per tablet   Oral   Take 1 tablet by mouth daily.         . clindamycin (CLEOCIN) 150 MG capsule   Oral   Take 3 capsules (450 mg total) by mouth 3 (three) times daily.   90 capsule   0     BP 178/52  Pulse 75  Temp(Src) 98 F (36.7 C) (Oral)  Resp 18  SpO2 96%  Physical Exam  Nursing note and vitals reviewed. Constitutional: He is oriented to person, place, and time.  He appears well-developed and  well-nourished. No distress.  HENT:  Head: Normocephalic and atraumatic.  Mouth/Throat: No oropharyngeal exudate.  Eyes: Conjunctivae are normal. Pupils are equal, round, and reactive to light.  Neck: Normal range of motion. Neck supple.  Cardiovascular: Normal rate and regular rhythm.   Pulses:      Dorsalis pedis pulses are 2+ on the right side, and 2+ on the left side.       Posterior tibial pulses are 2+ on the right side, and 2+ on the left side.  Pulmonary/Chest: Effort normal and breath sounds normal.  Abdominal: He exhibits no distension. There is no tenderness.  Musculoskeletal: Normal range of motion. He exhibits no edema and no tenderness.       Feet:  Neurological: He is alert and oriented to person, place, and time.  Skin: Skin is warm and dry.  Psychiatric: He has a normal mood and affect.    ED Course  Procedures (including critical care time)  Labs Reviewed  GLUCOSE, CAPILLARY - Abnormal; Notable for the following:    Glucose-Capillary 268 (*)    All other components within normal limits  BASIC METABOLIC PANEL - Abnormal; Notable for the following:    Glucose, Bld 251 (*)    BUN 26 (*)    GFR calc non Af Amer 87 (*)    All other components within normal limits  GLUCOSE, CAPILLARY - Abnormal; Notable for the following:    Glucose-Capillary 129 (*)    All other components within normal limits  CBC WITH DIFFERENTIAL   Dg Foot Complete Left  03/10/2013  *RADIOLOGY REPORT*  Clinical Data: Left foot redness and swelling in the region of the fourth and fifth metatarsals.  No known injury.  History of diabetes.  LEFT FOOT - COMPLETE 3+ VIEW  Comparison: None.  Findings: Normal appearing bones and soft tissues with the exception of mild dorsal soft tissue swelling, distally.  No soft tissue gas or underlying bony abnormality.  IMPRESSION: Mild dorsal distal soft tissue swelling.  Otherwise, normal examination.   Original Report Authenticated By: Beckie Salts, M.D.       1. Diabetic ulcer of toe      MDM  67 y/o male with history of DM, Peripheral neuropathy, HTN, HLD, Peripheral vascular disease who presents with diabetic foot wound. Good cap refill in affected digits. +2 DP and PT pulses. Mild surrounding erythema. Likely diabetic foot ulcer. XR without bony erosion or sub Q air. First dose of clindamycin given here for his surrounding cellulitis. The patient was discharged home with a script for clindamycin. He was instructed to wear tennis shoes instead of his boots. He was instructed to follow up with his primary care doctor next week for a recheck. Return precautions given and discussed with the patient who was in agreement with the plan.        Shanon Ace, MD 03/11/13 [phone]

## 2013-03-10 NOTE — ED Notes (Signed)
Wound care performed on pt's foot.

## 2013-03-11 NOTE — ED Provider Notes (Signed)
I performed a history and physical examination of  Anthony Petersen and discussed his management with Dr. Ethelene Browns. I agree with the history, physical, assessment, and plan of care, with the following exceptions: None I was present for the following procedures: None  Time Spent in Critical Care of the patient: None  Time spent in discussions with the patient and family: 10 minutes  Pt with hx of diabetes and neuropathy comes in with cc of foot ulcer. I agree with the exam finding above. There was some erythema of the foot as well. Plan is to treat patient as cellulitis, and if not better for patient to see his PCP for possible MRI to r/o Osteomyelitis.   Vlada Uriostegui  Derwood Kaplan, MD 03/11/13 1921

## 2013-04-13 ENCOUNTER — Ambulatory Visit: Payer: Medicare Other | Admitting: Internal Medicine

## 2013-04-23 ENCOUNTER — Ambulatory Visit (INDEPENDENT_AMBULATORY_CARE_PROVIDER_SITE_OTHER): Payer: Medicare Other | Admitting: Internal Medicine

## 2013-04-23 ENCOUNTER — Encounter: Payer: Self-pay | Admitting: Internal Medicine

## 2013-04-23 VITALS — BP 122/72 | HR 72 | Temp 98.2°F | Ht 71.0 in | Wt 224.0 lb

## 2013-04-23 DIAGNOSIS — R918 Other nonspecific abnormal finding of lung field: Secondary | ICD-10-CM

## 2013-04-23 DIAGNOSIS — J449 Chronic obstructive pulmonary disease, unspecified: Secondary | ICD-10-CM

## 2013-04-23 DIAGNOSIS — J9610 Chronic respiratory failure, unspecified whether with hypoxia or hypercapnia: Secondary | ICD-10-CM

## 2013-04-23 MED ORDER — ACLIDINIUM BROMIDE 400 MCG/ACT IN AEPB: 1.0000 | INHALATION_SPRAY | Freq: Two times a day (BID) | RESPIRATORY_TRACT | Status: DC

## 2013-04-23 NOTE — Assessment & Plan Note (Signed)
-   CT chest 07/14/12 At least two small right lung nodules measuring up to 4 mm. If  this patient has a history of primary lung malignancy, comparison  to prior images and/or attention on follow-up is suggested.  Otherwise, follow-up CT chest is suggested in 12 months   F/u cxr rec next ov as nothing to be gained by aggessive rx in this pt so severely limite by sob

## 2013-04-23 NOTE — Assessment & Plan Note (Signed)
-   Spirometry 12/17/11 (outside digital) 1.23 FEV1   - HFA 75% 09/04/2012    - Spirometry 09/04/2012 FEV1  1.53 (41%) ratio 49   - PFT's 10/16/12 FEV1  1.54 (48%) ratio 45 and 15% better p B2, DLC0 55% corrects to 73%  Variable airflow obst suggests he has some reversibility which needs to be more aggressively treated to push him more into a GOLD II category if possible given that he still has limiting symptoms on low dose laba/ics  The proper method of use, as well as anticipated side effects, of a metered-dose inhaler are discussed and demonstrated to the patient. Improved effectiveness after extensive coaching during this visit to a level of approximately  90% so try adding tudorza    Each maintenance medication was reviewed in detail including most importantly the difference between maintenance and as needed and under what circumstances the prns are to be used.  Please see instructions for details which were reviewed in writing and the patient given a copy.

## 2013-04-23 NOTE — Assessment & Plan Note (Signed)
-   -   08/07/2012  Walked RA  2 laps @ 185 ft each stopped due to  desat to 83%  corrected on 3lpm  -  Uses on 3lpm with activities outside the house  Adequate control on present rx, reviewed need to use 02 as prev rec that is 3lpm when walking outside

## 2013-04-23 NOTE — Progress Notes (Signed)
Subjective:    Patient ID: Anthony Petersen, male    DOB: [birth year]  MRN: [id number]  HPI  4 yowm quit smoking age 67 with LLL removed Sept 2012 HP ? Dx? Lung abscess with residual fluid L lung eval by Deretha Emory > rehab recommended but no f/u.  08/07/2012 1st pulmonary eval cc cough x 3 months gradually esp every night after supper before lie down with half tsp clear mucus.  Once uses pm symbicort  does better and Sleeping ok without nocturnal  or early am exacerbation  of respiratory  c/o's or need for noct saba.  rec Symbicort 160 Take 2 puffs first thing in am and then another 2 puffs about 12 hours later.  Continue spiriva for now Always wear 02 with cpap then during the day wear it with activity that is more intense than mailbox @ 3lpm  Please see patient coordinator before you leave today  to schedule records release from Dr Deboraha Sprang and Deretha Emory  Variable doe sometimes no limit even when not wearing 02 including raking leaves.  Mailbox and back no problem s 02 on spiriva in am and symbicort at lunch and sometimes an evening dose.  Better from rehab and lost ground ie can't walk more than 5 min s sob (off 02).   09/04/2012 f/u ov/Douglas Rooks cc overall better,  Going to y to walk indoor track twice weekly x 2 minutes and stop off 02 "don't want to get addicted" gaining wt.  Cough is variably better, still on symbicort and spiriva.   rec Wait at least a week and they try off spiriva to see what difference if any it makes with your exercise tolerance or cough (think of this like high octane fuel) Weight control   Work on inhaler technique:     For cough try delsym cough syrup and if cough still bothers you I recommend a trial of Try prilosec 20mg   Take 30-60 min before first meal of the day and Pepcid 20 mg one bedtime until cough is completely gone for at least a week without the need for cough suppression Please schedule a follow up office visit in 6 weeks, call sooner if needed with  PFT's  10/18/2012 f/u ov/Artavius Stearns cc  Improving sob despite stopping the spiriva and continuing on symbicort 160 2bid  rec Work maintaining perfect inhaler technique:   04/23/2013 f/u ov/Olive Zmuda re copd Chief Complaint  Patient presents with  . Follow-up    Pt c/o increased SOB for the past 2-3 months. He gets SOB with or without any exertion. He has occ cough, prod with minimal clear sputum.    not using 02 as much, only using symbicort 160 one bid, does have neb but no med for it, tried spiriva on off no def change    No obvious daytime variabilty or assoc excess mucus or change in vol, color  Or assoc cp or chest tightness, subjective wheeze overt sinus or hb symptoms. No unusual exp hx or h/o childhood pna/ asthma or premature birth to his knowledge. No longer needing cough meds  Sleeping ok without nocturnal  or early am exacerbation  of respiratory  c/o's or need for noct saba. Also denies any obvious fluctuation of symptoms with weather or environmental changes or other aggravating or alleviating factors except as outlined above   ROS  The following are not active complaints unless bolded sore throat, dysphagia, dental problems, itching, sneezing,  nasal congestion or excess/ purulent secretions, ear ache,   fever,  chills, sweats, unintended wt loss, pleuritic or exertional cp, hemoptysis,  orthopnea pnd or leg swelling, presyncope, palpitations, heartburn, abdominal pain, anorexia, nausea, vomiting, diarrhea  or change in bowel or urinary habits, change in stools or urine, dysuria,hematuria,  rash, arthralgias, visual complaints, headache, numbness weakness or ataxia or problems with walking or coordination,  change in mood/affect or memory.                Objective:   Physical Exam  amb wm nad with unusual affect  wt 228 09/04/2012 > 10/18/2012  213 > 04/23/2013  224  Wt Readings from Last 3 Encounters:  08/07/12 224 lb 12.8 oz (101.969 kg)  07/14/12 221 lb 11.2 oz (100.562 kg)   HEENT mild turbinate edema.  Oropharynx no thrush or excess pnd or cobblestoning.  No JVD or cervical adenopathy. Min accessory muscle hypertrophy. Trachea midline, nl thryroid. Chest was hyperinflated by percussion with diminished breath sounds and moderate increased exp time without wheeze. Hoover sign positive at end inspiration. Regular rate and rhythm without murmur gallop or rub or increase P2 or edema.  Abd: no hsm, nl excursion. Ext warm without cyanosis or clubbing.     U/s L chest 07/14/12 Limited left chest ultrasound reveals no significant effusion or  fluid amendable to thoracentesis.     Assessment & Plan:

## 2013-04-23 NOTE — Patient Instructions (Addendum)
Anthony Petersen twice immediately after symbicort 160 2 twice daily   Please schedule a follow up office visit in 6 weeks, call sooner if needed with cxr

## 2013-06-01 ENCOUNTER — Encounter: Payer: Self-pay | Admitting: Internal Medicine

## 2013-06-01 ENCOUNTER — Ambulatory Visit (INDEPENDENT_AMBULATORY_CARE_PROVIDER_SITE_OTHER): Payer: Medicare Other | Admitting: Internal Medicine

## 2013-06-01 ENCOUNTER — Ambulatory Visit (INDEPENDENT_AMBULATORY_CARE_PROVIDER_SITE_OTHER)
Admission: RE | Admit: 2013-06-01 | Discharge: 2013-06-01 | Disposition: A | Discharge status: Home / Self Care | Payer: Medicare Other | Source: Ambulatory Visit | Attending: Internal Medicine | Admitting: Internal Medicine

## 2013-06-01 VITALS — BP 152/80 | HR 60 | Temp 97.3°F | Ht 70.0 in | Wt 230.0 lb

## 2013-06-01 DIAGNOSIS — Z9889 Other specified postprocedural states: Secondary | ICD-10-CM

## 2013-06-01 DIAGNOSIS — J449 Chronic obstructive pulmonary disease, unspecified: Secondary | ICD-10-CM

## 2013-06-01 DIAGNOSIS — Z902 Acquired absence of lung [part of]: Secondary | ICD-10-CM

## 2013-06-01 DIAGNOSIS — J9610 Chronic respiratory failure, unspecified whether with hypoxia or hypercapnia: Secondary | ICD-10-CM

## 2013-06-01 NOTE — Assessment & Plan Note (Signed)
-   Spirometry 12/17/11 (outside digital) 1.23 FEV1   - HFA 75% p coaching 06/01/2013    - Spirometry 09/04/2012 FEV1  1.53 (41%) ratio 49   - PFT's 10/16/12 FEV1  1.54 (48%) ratio 45 and 15% better p B2, DLC0 55% corrects to 73%  Symptoms are markedly disproportionate to objective findings and not clear this is all a lung problem but pt does appear to have difficult airway management issues. DDX of  difficult airways managment all start with A and  include Adherence, Ace Inhibitors, Acid Reflux, Active Sinus Disease, Alpha 1 Antitripsin deficiency, Anxiety masquerading as Airways dz,  ABPA,  allergy(esp in young), Aspiration (esp in elderly), Adverse effects of DPI,  Active smokers, plus two Bs  = Bronchiectasis and Beta blocker use..and one C= CHF   Adherence is always the initial "prime suspect" and is a multilayered concern that requires a "trust but verify" approach in every patient - starting with knowing how to use medications, especially inhalers, correctly, keeping up with refills and understanding the fundamental difference between maintenance and prns vs those medications only taken for a very short course and then stopped and not refilled. The proper method of use, as well as anticipated side effects, of a metered-dose inhaler are discussed and demonstrated to the patient. Improved effectiveness after extensive coaching during this visit to a level of approximately  75% so stay on symbicort but work on technique  ? Anxiety/ depression/ deconditioning > See the written copy of this report in the patient's paper medical record.  These results did not interface directly into the electronic medical record and are summarized here.

## 2013-06-01 NOTE — Assessment & Plan Note (Signed)
-   08/07/2012  Walked RA  2 laps @ 185 ft each stopped due to  desat to 83%  corrected on 3lpm - 06/01/2013   Walked 3lpm x one lap @ 185 stopped due to foot pain with no sob/tachypnea/ pulse only 77, no desat     - 02 at 3lpm at hs,  Uses on 3lpm with activities outside the house, discussed

## 2013-06-01 NOTE — Progress Notes (Signed)
Subjective:    Patient ID: Anthony Petersen, male    DOB: 16-Oct-1946  MRN: [id number]  HPI  19  yowm quit smoking age 67 with LLL removed Sept 2012 HP ? Dx? Lung abscess with residual fluid L lung eval by Anthony Petersen > rehab recommended but no f/u.  08/07/2012 1st pulmonary eval cc cough x 3 months gradually esp every night after supper before lie down with half tsp clear mucus.  Once uses pm symbicort  does better and Sleeping ok without nocturnal  or early am exacerbation  of respiratory  c/o's or need for noct saba.  rec Symbicort 160 Take 2 puffs first thing in am and then another 2 puffs about 12 hours later.  Continue spiriva for now Always wear 02 with cpap then during the day wear it with activity that is more intense than mailbox @ 3lpm  Please see patient coordinator before you leave today  to schedule records release from Dr Deboraha Petersen and Anthony Petersen  Variable doe sometimes no limit even when not wearing 02 including raking leaves.  Mailbox and back no problem s 02 on spiriva in am and symbicort at lunch and sometimes an evening dose.  Better from rehab and lost ground ie can't walk more than 5 min s sob (off 02).   09/04/2012 f/u ov/Anthony Petersen cc overall better,  Going to y to walk indoor track twice weekly x 2 minutes and stop off 02 "don't want to get addicted" gaining wt.  Cough is variably better, still on symbicort and spiriva.   rec Wait at least a week and they try off spiriva to see what difference if any it makes with your exercise tolerance or cough (think of this like high octane fuel) Weight control   Work on inhaler technique:     For cough try delsym cough syrup and if cough still bothers you I recommend a trial of Try prilosec 20mg   Take 30-60 min before first meal of the day and Pepcid 20 mg one bedtime until cough is completely gone for at least a week without the need for cough suppression Please schedule a follow up office visit in 6 weeks, call sooner if needed with  PFT's  10/18/2012 f/u ov/Anthony Petersen cc  Improving sob despite stopping the spiriva and continuing on symbicort 160 2bid  rec Work maintaining perfect inhaler technique:   04/23/2013 f/u ov/Anthony Petersen re copd Chief Complaint  Patient presents with  . Follow-up    Pt c/o increased SOB for the past 2-3 months. He gets SOB with or without any exertion. He has occ cough, prod with minimal clear sputum.    not using 02 as much, only using symbicort 160 one bid, does have neb but no med for it, tried spiriva on off no def change. Anthony Petersen one twice immediately after symbicort 160 2 twice daily  Please schedule a follow up office visit in 6 weeks, call sooner if needed with cxr   06/01/2013 f/u ov/Anthony Petersen re copd/ doe/ 02 dep at hs and with ex Chief Complaint  Patient presents with  . Follow-up    Breathing slightly improved since the last visit. Cough some better.   doe room to room on RA > worse for a month On 02 3lpm when walks at store x one aisle> stops due to L foot pain Does not have any rescue rx Gets angry easily when talking about rehab "I did fine then" "I can do the same thing at the y if I just  had the breath"    No obvious daytime variabilty or cough    Or assoc cp or chest tightness, subjective wheeze overt sinus or hb symptoms. No unusual exp hx or h/o childhood pna/ asthma or premature birth to his knowledge. No longer needing cough meds  Sleeping ok without nocturnal  or early am exacerbation  of respiratory  c/o's or need for noct saba. Also denies any obvious fluctuation of symptoms with weather or environmental changes or other aggravating or alleviating factors except as outlined above   Current Medications, Allergies, Past Medical History, Past Surgical History, Family History, and Social History were reviewed in [employer] record.  Current Medications, Allergies, Past Medical History, Past Surgical History, Family History, and Social History were reviewed in  [employer] record.  ROS  The following are not active complaints unless bolded sore throat, dysphagia, dental problems, itching, sneezing,  nasal congestion or excess/ purulent secretions, ear ache,   fever, chills, sweats, unintended wt loss, pleuritic or exertional cp, hemoptysis,  orthopnea pnd or leg swelling, presyncope, palpitations, heartburn, abdominal pain, anorexia, nausea, vomiting, diarrhea  or change in bowel or urinary habits, change in stools or urine, dysuria,hematuria,  rash, arthralgias, visual complaints, headache, numbness weakness or ataxia or problems with walking or coordination,  change in mood/affect or memory.                   Objective:   Physical Exam  Obese amb wm nad with unusual affect> overtly hopeless/helpless   wt 228 09/04/2012 > 10/18/2012  213 > 04/23/2013  224 > 06/01/2013  230  Wt Readings from Last 3 Encounters:  08/07/12 224 lb 12.8 oz (101.969 kg)  07/14/12 221 lb 11.2 oz (100.562 kg)  HEENT mild turbinate edema.  Oropharynx no thrush or excess pnd or cobblestoning.  No JVD or cervical adenopathy. Min accessory muscle hypertrophy. Trachea midline, nl thryroid. Chest was hyperinflated by percussion with diminished breath sounds and moderate increased exp time without wheeze. Hoover sign positive at end inspiration. Regular rate and rhythm without murmur gallop or rub or increase P2 or edema.  Abd: no hsm, nl excursion. Ext warm without cyanosis or clubbing.       CXR  06/01/2013 :  Opacification in the left base improved compared to the prior exam likely residual scarring, although cannot exclude a recurrent acute process such as atelectasis or infection.      Assessment & Plan:

## 2013-06-01 NOTE — Patient Instructions (Addendum)
Try off tudorza and if worse call for prescription  Ok to wear 02 whenever you are short of breath.   Work on inhaler technique:  relax and gently blow all the way out then take a nice smooth deep breath back in, triggering the inhaler at same time you start breathing in.  Hold for up to 5 seconds if you can.  Rinse and gargle with water when done  Weight control is simply a matter of calorie balance which needs to be tilted in your favor by eating less and exercising more.  To get the most out of exercise, you need to be continuously aware that you are short of breath, but never out of breath, for 30 minutes daily. As you improve, it will actually be easier for you to do the same amount of exercise  in  20 - 30 minutes on 3lpm  so always push to the level where you are short of breath.  If this does not result in gradual weight reduction then I strongly recommend you see a nutritionist with a food diary x 2 weeks so that we can work out a negative calorie balance which is universally effective in steady weight loss programs.  Think of your calorie balance like you do your bank account where in this case you want the balance to go down so you must take in less calories than you burn up.  It's just that simple:  Hard to do, but easy to understand.  Good luck!   If not improving please schedule return in 6 weeks with PFT's on return

## 2013-06-01 NOTE — Assessment & Plan Note (Signed)
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

## 2013-06-21 ENCOUNTER — Telehealth: Payer: Self-pay | Admitting: Internal Medicine

## 2013-06-21 NOTE — Telephone Encounter (Signed)
I spoke with pt spouse and she states over the last week the pt has been having increased SOB, and productive cough with dark phlegm, and blood. She states he just told her about the blood today but says he has been coughing this up x 1 week as well. I advised pt needs to come in for OV. They could not come in today so appt set for 8:45 am tomorrow with MW. Carron Curie, CMA

## 2013-06-22 ENCOUNTER — Ambulatory Visit (INDEPENDENT_AMBULATORY_CARE_PROVIDER_SITE_OTHER)
Admission: RE | Admit: 2013-06-22 | Discharge: 2013-06-22 | Disposition: A | Discharge status: Home / Self Care | Payer: Medicare Other | Source: Ambulatory Visit | Attending: Internal Medicine | Admitting: Internal Medicine

## 2013-06-22 ENCOUNTER — Ambulatory Visit (INDEPENDENT_AMBULATORY_CARE_PROVIDER_SITE_OTHER): Payer: Medicare Other | Admitting: Internal Medicine

## 2013-06-22 ENCOUNTER — Encounter: Payer: Self-pay | Admitting: Internal Medicine

## 2013-06-22 VITALS — BP 150/82 | HR 66 | Temp 97.6°F | Ht 71.0 in | Wt 236.0 lb

## 2013-06-22 DIAGNOSIS — R05 Cough: Secondary | ICD-10-CM

## 2013-06-22 DIAGNOSIS — J449 Chronic obstructive pulmonary disease, unspecified: Secondary | ICD-10-CM

## 2013-06-22 DIAGNOSIS — R0600 Dyspnea, unspecified: Secondary | ICD-10-CM

## 2013-06-22 DIAGNOSIS — R0989 Other specified symptoms and signs involving the circulatory and respiratory systems: Secondary | ICD-10-CM

## 2013-06-22 DIAGNOSIS — J9610 Chronic respiratory failure, unspecified whether with hypoxia or hypercapnia: Secondary | ICD-10-CM

## 2013-06-22 DIAGNOSIS — R042 Hemoptysis: Secondary | ICD-10-CM

## 2013-06-22 MED ORDER — ALBUTEROL SULFATE HFA 108 (90 BASE) MCG/ACT IN AERS: 2.0000 | INHALATION_SPRAY | Freq: Four times a day (QID) | RESPIRATORY_TRACT | Status: AC | PRN

## 2013-06-22 MED ORDER — TRAMADOL HCL 50 MG PO TABS: ORAL_TABLET | ORAL | Status: AC

## 2013-06-22 MED ORDER — LEVOFLOXACIN 750 MG PO TABS: 750.0000 mg | ORAL_TABLET | Freq: Every day | ORAL | Status: AC

## 2013-06-22 MED ORDER — ALBUTEROL SULFATE (2.5 MG/3ML) 0.083% IN NEBU
2.5000 mg | INHALATION_SOLUTION | Freq: Four times a day (QID) | RESPIRATORY_TRACT | Status: AC | PRN
Start: 2013-06-22 — End: ?

## 2013-06-22 NOTE — Progress Notes (Signed)
Subjective:    Patient ID: Anthony Petersen, male    DOB: 03/16/1946  MRN: [id number]   Brief patient profile:  39  yowm quit smoking age 67 with LLL removed Sept 2012 [facility] ? Dx? 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  08/07/2012 1st pulmonary eval cc cough x 3 months gradually esp every night after supper before lie down with half tsp clear mucus.  Once uses pm symbicort  does better and Sleeping ok without nocturnal  or early am exacerbation  of respiratory  c/o's or need for noct saba.  rec Symbicort 160 Take 2 puffs first thing in am and then another 2 puffs about 12 hours later.  Continue spiriva for now Always wear 02 with cpap then during the day wear it with activity that is more intense than mailbox @ 3lpm  Please see patient coordinator before you leave today  to schedule records release from Dr Deboraha Sprang and Deretha Emory  Variable doe sometimes no limit even when not wearing 02 including raking leaves.  Mailbox and back no problem s 02 on spiriva in am and symbicort at lunch and sometimes an evening dose.  Better from rehab and lost ground ie can't walk more than 5 min s sob (off 02).   09/04/2012 f/u ov/Phung Kotas cc overall better,  Going to y to walk indoor track twice weekly x 2 minutes and stop off 02 "don't want to get addicted" gaining wt.  Cough is variably better, still on symbicort and spiriva.   rec Wait at least a week and they try off spiriva to see what difference if any it makes with your exercise tolerance or cough (think of this like high octane fuel) Weight control   Work on inhaler technique:     For cough try delsym cough syrup and if cough still bothers you I recommend a trial of Try prilosec 20mg   Take 30-60 min before first meal of the day and Pepcid 20 mg one bedtime until cough is completely gone for at least a week without the need for cough suppression Please schedule a follow up office visit in 6 weeks, call  sooner if needed with PFT's  10/18/2012 f/u ov/Nasirah Sachs cc  Improving sob despite stopping the spiriva and continuing on symbicort 160 2bid  rec Work maintaining perfect inhaler technique:   04/23/2013 f/u ov/Braiden Rodman re copd Chief Complaint  Patient presents with  . Follow-up    Pt c/o increased SOB for the past 2-3 months. He gets SOB with or without any exertion. He has occ cough, prod with minimal clear sputum.    not using 02 as much, only using symbicort 160 one bid, does have neb but no med for it, tried spiriva on off no def change. [employer] one twice immediately after symbicort 160 2 twice daily  Please schedule a follow up office visit in 6 weeks, call sooner if needed with cxr   06/01/2013 f/u ov/Aariz Maish re copd/ doe/ 02 dep at hs and with ex Chief Complaint  Patient presents with  . Follow-up    Breathing slightly improved since the last visit. Cough some better.   doe room to room on RA > worse for a month On 02 3lpm when walks at store x one aisle> stops due to L foot pain Does not have any rescue rx Gets angry easily when talking about rehab "I did fine then" "I can do the same thing  at the y if I just had the breath"  rec Try off tudorza and if worse call for prescription Ok to wear 02 whenever you are short of breath.  Work on inhaler technique   06/22/2013 f/u ov/Emilee Market re cough turned bloody on ASA Chief Complaint  Patient presents with  . Acute Visit    Pt c/o increased SOB and prod cough x 1 wk. Cough is prod with moderate brown sputum, with occ blood.   for a week stopped tudorza no change then abrupt cough with brb x sev tbsp total per dary without prio  change baseline cough =  daily p supper < tsp or two usually just white - last bloody in 2012 resulting in eval in [facility] and [facility] with ? Dx of BOOP "but I'm never taking pred again"  No obvious daytime variabilty  Or assoc cp or chest tightness, subjective wheeze overt sinus or hb symptoms. No unusual exp hx or h/o childhood  pna/ asthma or premature birth to his knowledge.    Sleeping ok without nocturnal  or early am exacerbation  of respiratory  c/o's or need for noct saba. Also denies any obvious fluctuation of symptoms with weather or environmental changes or other aggravating or alleviating factors except as outlined above      Current Medications, Allergies, Past Medical History, Past Surgical History, Family History, and Social History were reviewed in [employer] record.  ROS  The following are not active complaints unless bolded sore throat, dysphagia, dental problems, itching, sneezing,  nasal congestion or excess/ purulent secretions, ear ache,   fever, chills, sweats, unintended wt loss, pleuritic or exertional cp, hemoptysis,  orthopnea pnd or leg swelling, presyncope, palpitations, heartburn, abdominal pain, anorexia, nausea, vomiting, diarrhea  or change in bowel or urinary habits, change in stools or urine, dysuria,hematuria,  rash, arthralgias, visual complaints, headache, numbness weakness or ataxia or problems with walking or coordination,  change in mood/affect or memory.                   Objective:   Physical Exam  Obese amb wm nad with unusual affect> overtly hopeless/helpless at times angry   wt 228 09/04/2012 > 10/18/2012  213 > 04/23/2013  224 > 06/01/2013  230 > 06/22/2013  236  07/14/12 221 lb 11.2 oz (100.562 kg)  HEENT mild turbinate edema.  Oropharynx no thrush or excess pnd or cobblestoning.  No JVD or cervical adenopathy. Min accessory muscle hypertrophy. Trachea midline, nl thryroid. Chest was hyperinflated by percussion with diminished breath sounds and moderate increased exp time without wheeze. Hoover sign positive at end inspiration. Regular rate and rhythm without murmur gallop or rub or increase P2 or edema.  Abd: no hsm, nl excursion. Ext warm without cyanosis or clubbing.       cxr 06/22/13 Similar pleural parenchymal opacification throughout the  inferior left hemithorax, most consistent with scarring. No evidence of acute superimposed disease.  COPD/chronic bronchitis.       Assessment & Plan:   Outpatient Encounter Prescriptions as of 06/22/2013  Medication Sig Dispense Refill  . aspirin EC 81 MG tablet Take 81 mg by mouth daily.      . budesonide-formoterol (SYMBICORT) 160-4.5 MCG/ACT inhaler Inhale 2 puffs into the lungs 2 (two) times daily.  1 Inhaler  11  . FLUoxetine (PROZAC) 40 MG capsule Take 40 mg by mouth daily.      Marland Kitchen gabapentin (NEURONTIN) 400 MG capsule Take 800 mg by mouth 3 (  three) times daily.      . insulin aspart (NOVOLOG) 100 UNIT/ML injection As directed      . insulin glargine (LANTUS) 100 UNIT/ML injection Inject 24 Units into the skin 2 (two) times daily.       . ondansetron (ZOFRAN-ODT) 4 MG disintegrating tablet Take 4 mg by mouth every 8 (eight) hours as needed for nausea.      . simvastatin (ZOCOR) 10 MG tablet Take 10 mg by mouth at bedtime.      . valsartan-hydrochlorothiazide (DIOVAN-HCT) 160-12.5 MG per tablet Take 1 tablet by mouth daily.      Marland Kitchen zolpidem (AMBIEN) 10 MG tablet Take 10 mg by mouth at bedtime as needed for sleep.      Marland Kitchen albuterol (PROAIR HFA) 108 (90 BASE) MCG/ACT inhaler Inhale 2 puffs into the lungs every 6 (six) hours as needed for wheezing. 2 puffs every 4 hours as needed only  if your can't catch your breath      . albuterol (PROVENTIL) (2.5 MG/3ML) 0.083% nebulizer solution Take 3 mLs (2.5 mg total) by nebulization every 6 (six) hours as needed for wheezing.  75 mL  1  . levofloxacin (LEVAQUIN) 750 MG tablet Take 1 tablet (750 mg total) by mouth daily. One daily stop if develop aching in joints/ muscles  5 tablet  0  . traMADol (ULTRAM) 50 MG tablet 1-2 every 4 hours as needed for cough or pain  40 tablet  0  . [DISCONTINUED] Aclidinium Bromide (TUDORZA PRESSAIR) 400 MCG/ACT AEPB Inhale 1 puff into the lungs 2 (two) times daily. One twice daily  1 each  11   No  facility-administered encounter medications on file as of 06/22/2013.

## 2013-06-22 NOTE — Patient Instructions (Addendum)
Continue symbicort 160 Take 2 puffs first thing in am and then another 2 puffs about 12 hours later.   Levaquin 750 one daily x 5 days  Only use your albuterol (proaire first then nebulizer is a second option) as a rescue medication to be used if you can't catch your breath by resting or doing a relaxed purse lip breathing pattern. The less you use it, the better it will work when you need it. Ok to use it up to every 4 hours if needed  Mucinex dm 1200 mg every 12 hours for cough and supplement with tramadol 50 mg up to 1 every 4 hours as needed   Try prilosec 20mg   Take 30-60 min before first meal of the day and Pepcid 20 mg one bedtime until cough is completely gone for at least a week without the need for cough suppression  Hold aspirin when actively bleeding     Please see patient coordinator before you leave today  to schedule f/u with Dr Deretha Emory at [facility] asap

## 2013-06-23 DIAGNOSIS — R042 Hemoptysis: Secondary | ICD-10-CM | POA: Insufficient documentation

## 2013-06-23 NOTE — Assessment & Plan Note (Signed)
This probably just represents acute bronchitis inpt with chronic bronchitis/ copd on asa   rec he stop it and take levaquin  I had an extended discussion with the patient today lasting 15 to 20 minutes of a 25 minute visit on the following issues:   He is not happy with his progress here and previously extensively eval at [facility] with rec "just do rehab" - I doubt this was the case but his problems are complex and he's convinced "it's coming back just like it did before " (whatever "it" was is not clear by the [facility] records available to me).   Therefore referred back to [facility] for re-evaluation  See instructions for specific recommendations which were reviewed directly with the patient who was given a copy with highlighter outlining the key components.   Marland Kitchen

## 2013-06-23 NOTE — Assessment & Plan Note (Signed)
-   08/07/2012  Walked RA  2 laps @ 185 ft each stopped due to  desat to 83%  corrected on 3lpm - 06/01/2013   Walked 3lpm x one lap @ 185 stopped due to foot pain with no sob/tachypnea/ pulse only 77, no desat     - 02 at 3lpm at hs,  Uses on 3lpm with activities outside the house  Adequate control on present rx (note sats fine despite exacerbation today)

## 2013-06-23 NOTE — Assessment & Plan Note (Signed)
-   Spirometry 12/17/11 (outside digital) 1.23 FEV1   - Spirometry 09/04/2012 FEV1  1.53 (41%) ratio 49   - PFT's 10/16/12 FEV1  1.54 (48%) ratio 45 and 15% better p B2, DLC0 55% corrects to 73% - Tudorza trial stopped 06/12/13 > no perceived benefit    The proper method of use, as well as anticipated side effects, of a metered-dose inhaler are discussed and demonstrated to the patient. Improved effectiveness after extensive coaching during this visit to a level of approximately  90% so should do fine just on symbicort 160 2 bid and prn saba, reviewed

## 2013-06-23 NOTE — Assessment & Plan Note (Signed)
Symptoms are markedly disproportionate to objective findings and not clear this is a lung problem but pt does appear to have difficult airway management issues. DDX of  difficult airways managment all start with A and  include Adherence, Ace Inhibitors, Acid Reflux, Active Sinus Disease, Alpha 1 Antitripsin deficiency, Anxiety masquerading as Airways dz,  ABPA,  allergy(esp in young), Aspiration (esp in elderly), Adverse effects of DPI,  Active smokers, plus two Bs  = Bronchiectasis and Beta blocker use..and one C= CHF   Adherence is always the initial "prime suspect" and is a multilayered concern that requires a "trust but verify" approach in every patient - starting with knowing how to use medications, especially inhalers, correctly, keeping up with refills and understanding the fundamental difference between maintenance and prns vs those medications only taken for a very short course and then stopped and not refilled.    ? Acid reflux > rx  Until cough gone to his satisfaction  ? Adverse effect of dpi > probably not as stopped tudorza 1 week before exac but would hold off further dpi for now as didn't help breathing anyway

## 2013-06-25 NOTE — Progress Notes (Signed)
Quick Note:  Spoke with pt and notified of results per Dr. Wert. Pt verbalized understanding and denied any questions.  ______ 

## 2013-06-27 IMAGING — CR DG CHEST 2V
2 series · 2 of 2 positions shown · non-contrast
Comparison: 07/13/2012

CLINICAL DATA: COPD, shortness of breath and cough.

CHEST - 2 VIEW

[view not recorded (1 of 2)]
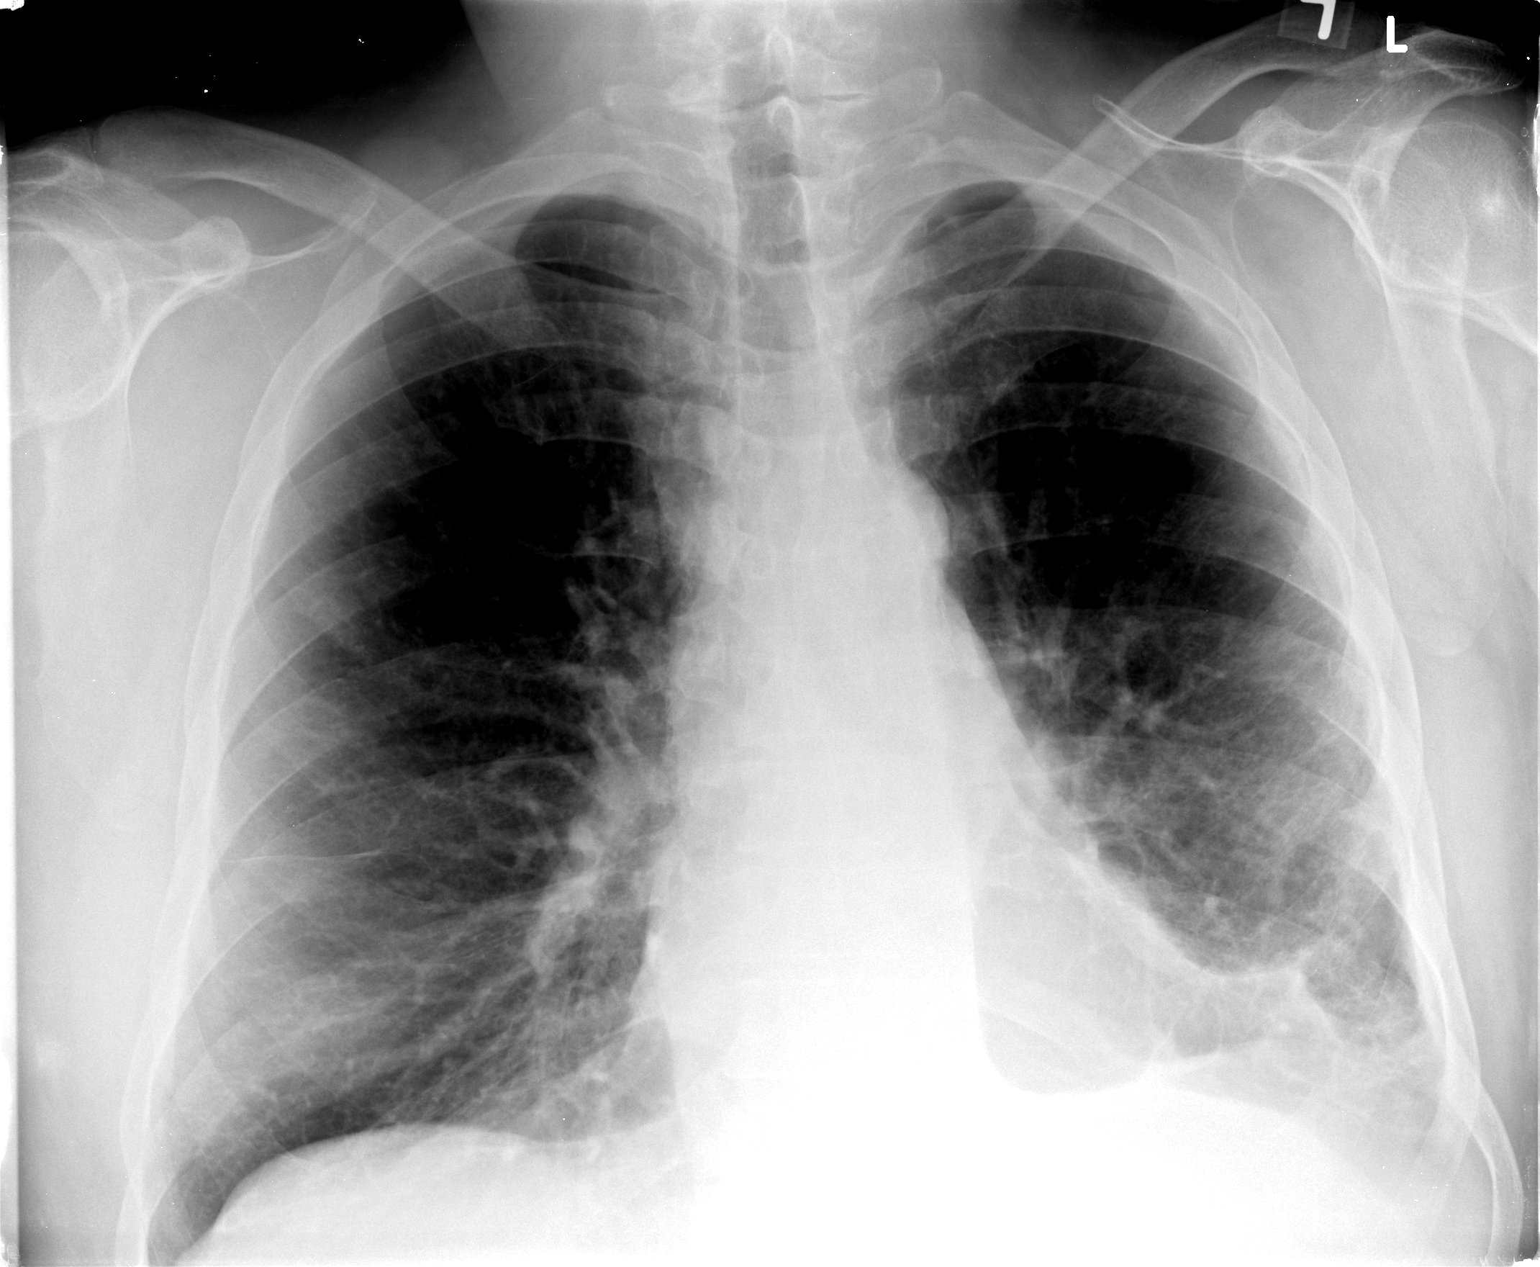

[view not recorded (2 of 2)]
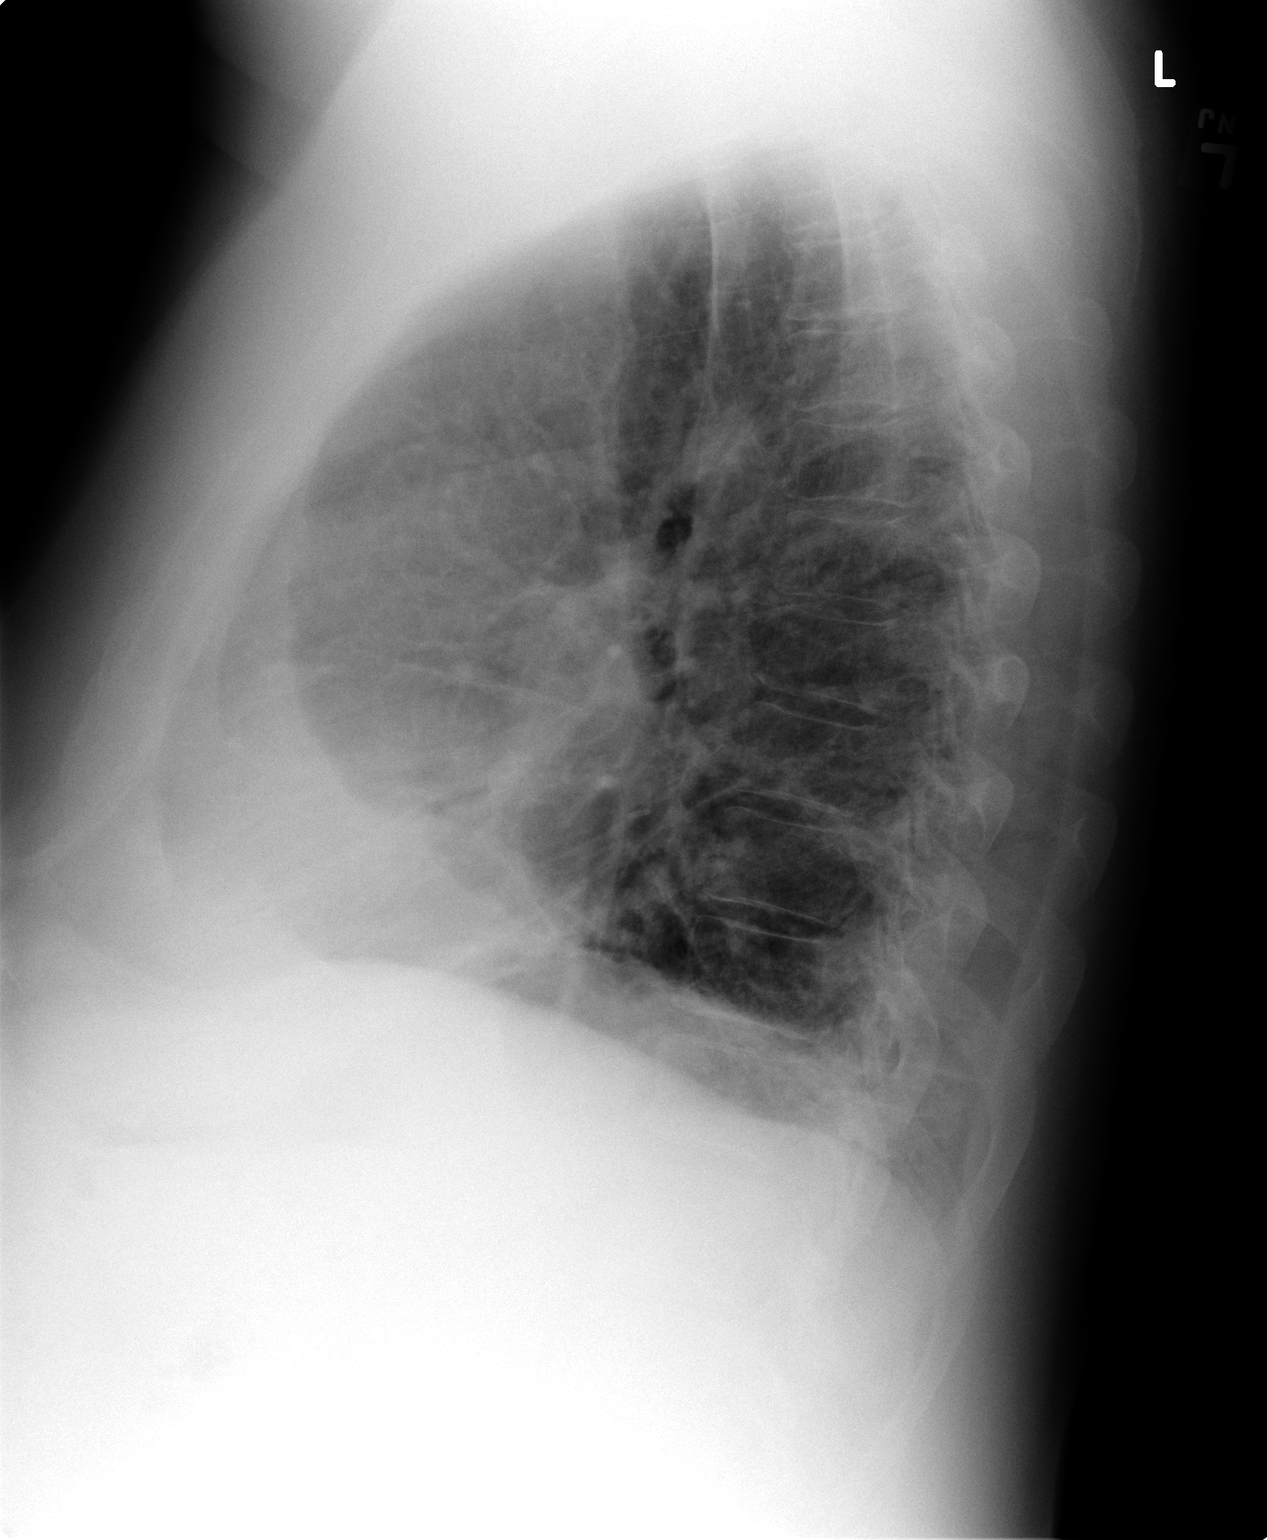

[2 of 2 positions shown; findings below may reference images not displayed]

FINDINGS: Lungs are adequately inflated with persistent mild
opacification in the left bases improved compared to the prior exam
as this may represent residual scarring, although cannot exclude a
recurrent acute process.  Cardiomediastinal silhouette and
remainder of the exam is unchanged.
IMPRESSION: Opacification in the left base improved compared to the prior exam
likely residual scarring, although cannot exclude a recurrent acute
process such as atelectasis or infection.

## 2013-07-13 ENCOUNTER — Ambulatory Visit: Payer: Medicare Other | Admitting: Internal Medicine

## 2013-07-18 IMAGING — CR DG CHEST 2V
2 series · 2 of 2 positions shown · non-contrast
Comparison: 06/01/2013.  Comparison back to 07/13/2012

CLINICAL DATA: Shortness of breath.  Hypertension.  Ex-smoker.
COPD.

CHEST - 2 VIEW

[view not recorded (1 of 2)]
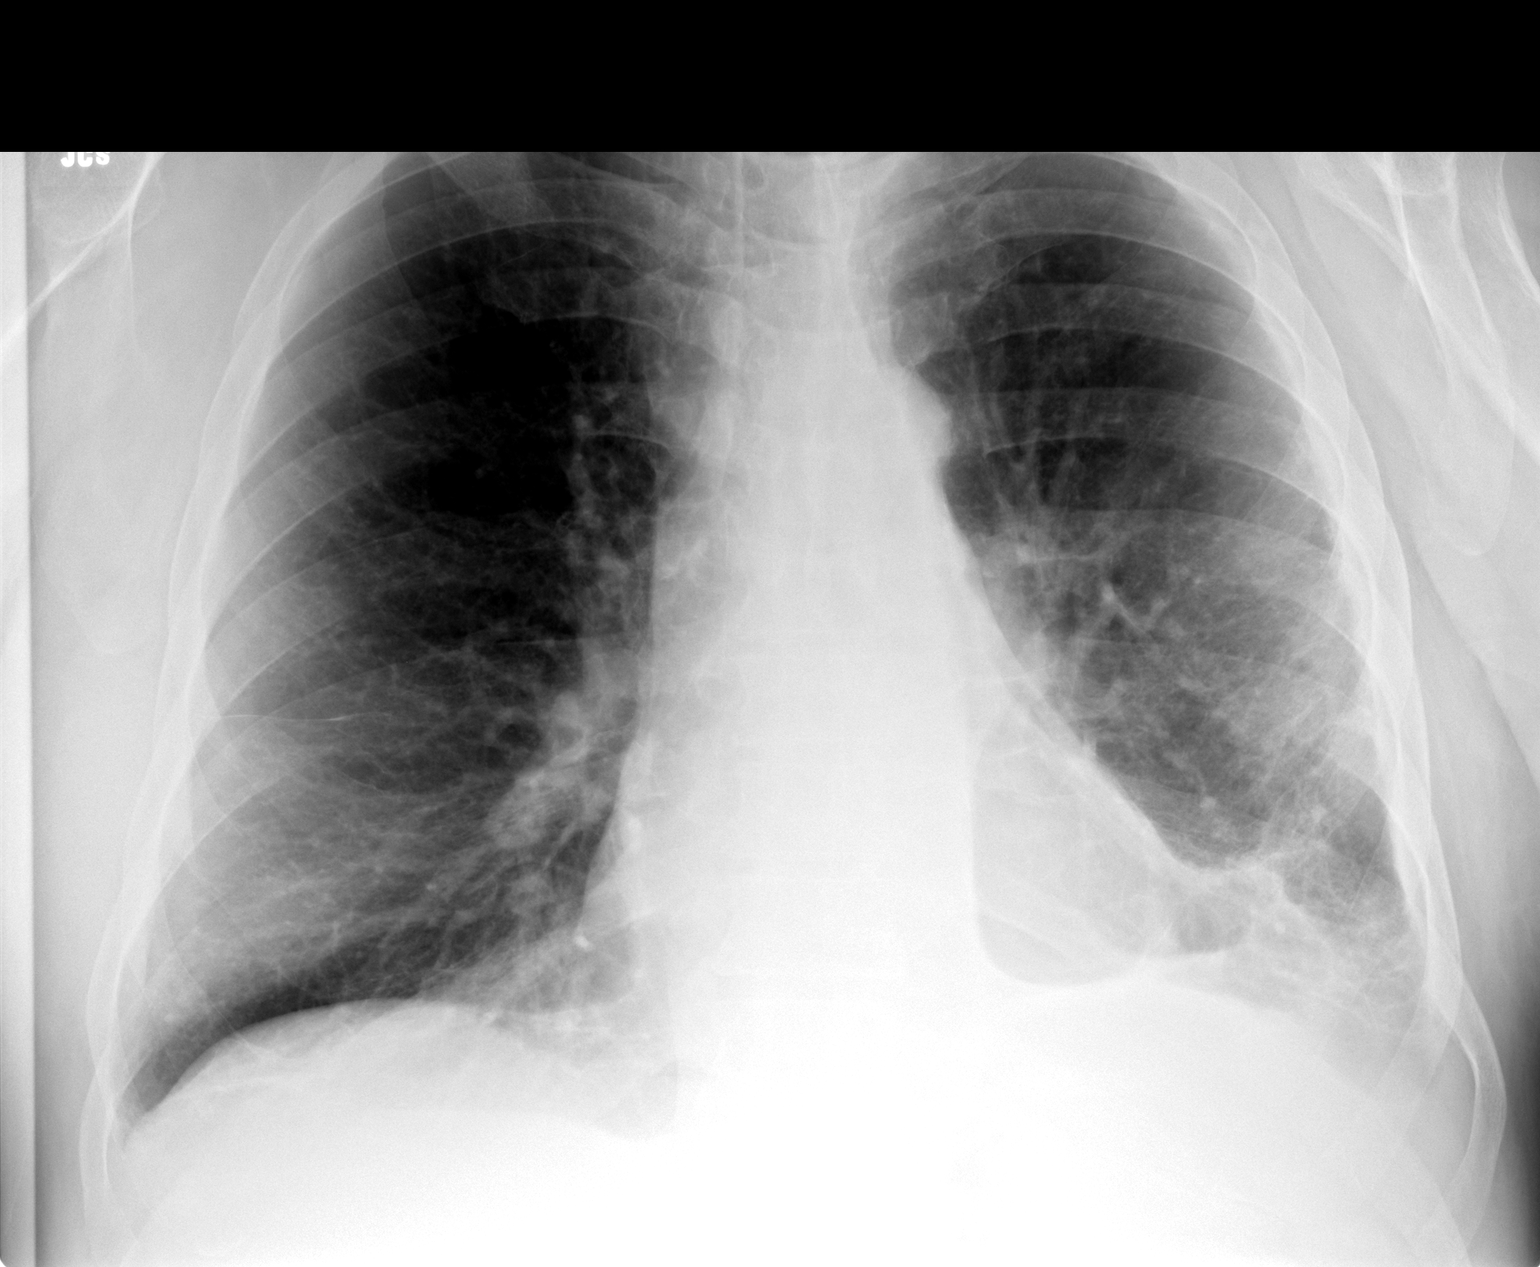

[view not recorded (2 of 2)]
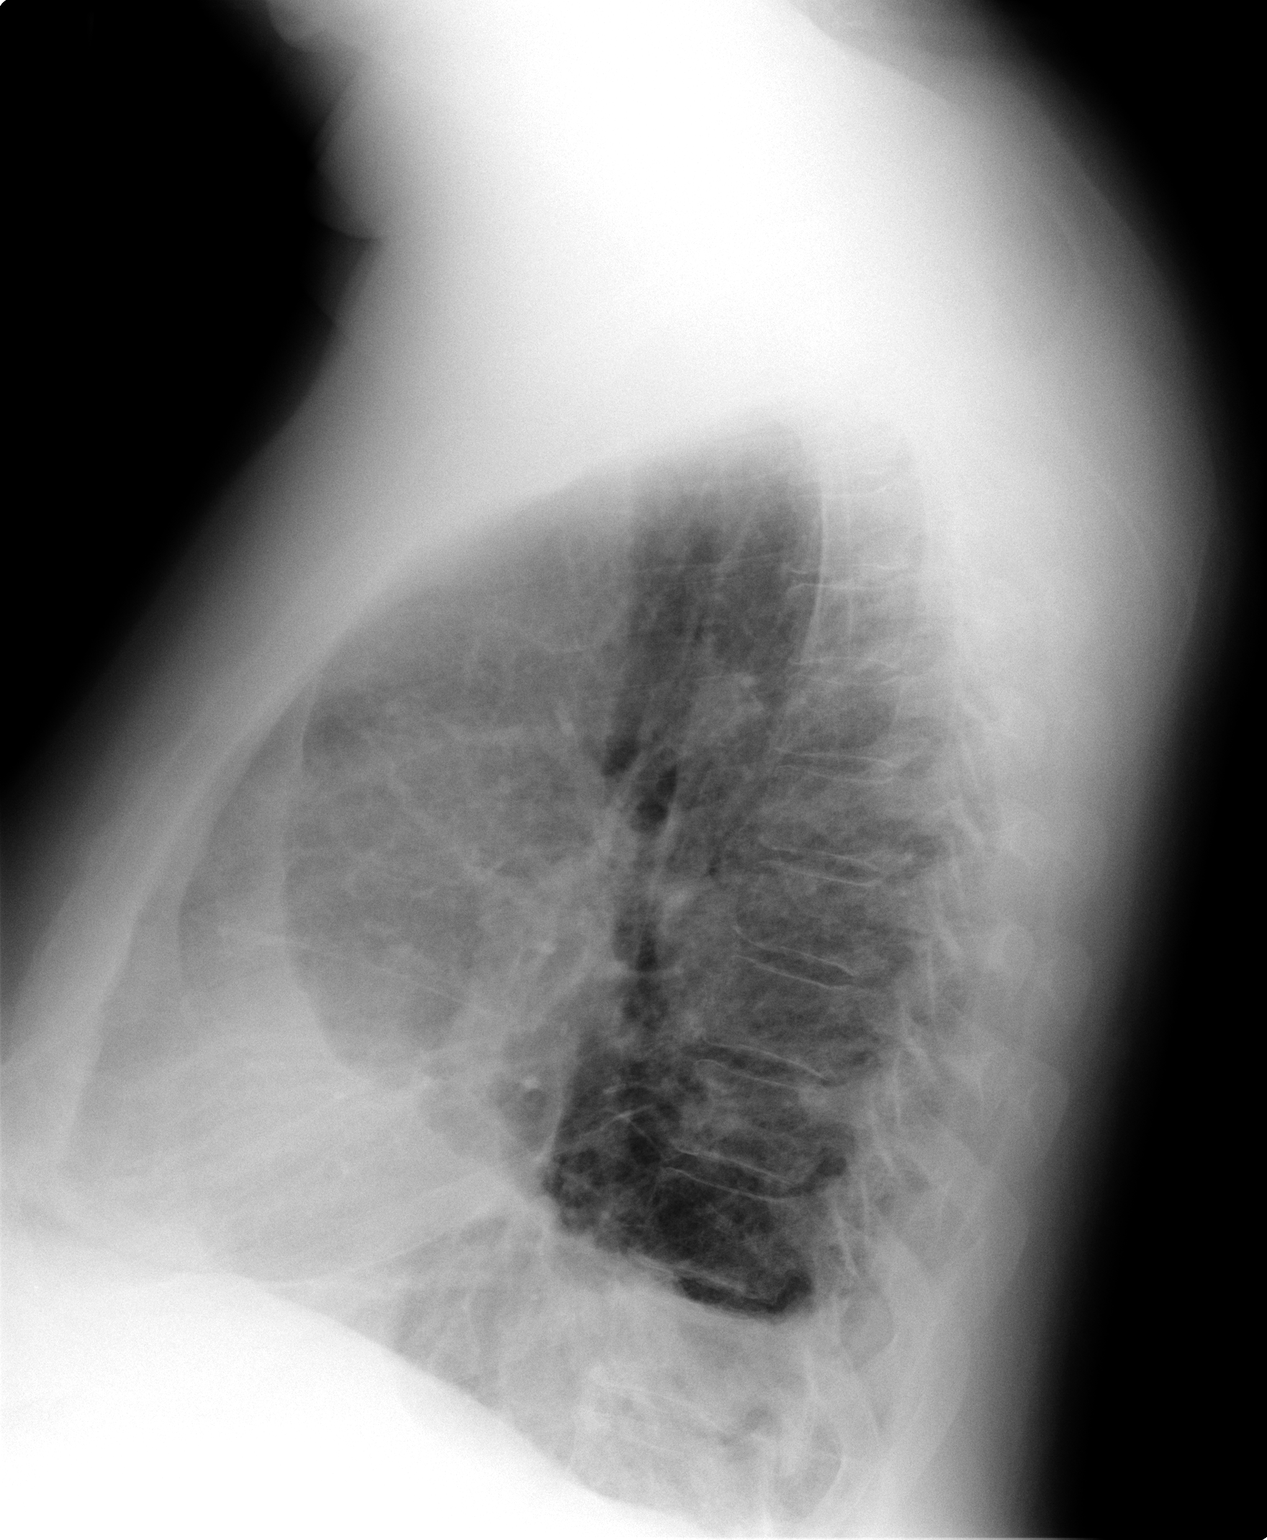

[2 of 2 positions shown; findings below may reference images not displayed]

FINDINGS: Midline trachea.  Mild cardiomegaly. Mediastinal contours
otherwise within normal limits.  No pleural fluid.  Pleural
parenchymal opacification within the inferior left hemithorax is
similar back to 07/13/2012. No pneumothorax.    Lower lobe
predominant interstitial thickening is greater on the left than
right and chronic.
IMPRESSION: Similar pleural parenchymal opacification throughout the inferior
left hemithorax, most consistent with scarring.  No evidence of
acute superimposed disease.

COPD/chronic bronchitis.

## 2013-09-03 ENCOUNTER — Ambulatory Visit: Payer: Medicare Other | Admitting: Internal Medicine

## 2013-10-31 ENCOUNTER — Encounter (HOSPITAL_COMMUNITY): Payer: Self-pay

## 2013-10-31 ENCOUNTER — Encounter (HOSPITAL_COMMUNITY)
Admission: RE | Admit: 2013-10-31 | Discharge: 2013-10-31 | Disposition: A | Discharge status: Home / Self Care | Payer: Medicare Other | Source: Ambulatory Visit | Attending: Internal Medicine | Admitting: Internal Medicine

## 2013-10-31 HISTORY — DX: Heart failure, unspecified: I50.9

## 2013-10-31 HISTORY — DX: Cerebral infarction, unspecified: I63.9

## 2013-10-31 HISTORY — DX: Syncope and collapse: R55

## 2013-10-31 HISTORY — DX: Disorder of thyroid, unspecified: E07.9

## 2013-10-31 NOTE — Progress Notes (Addendum)
Anthony Petersen 67 y.o. male  Initial Psychosocial Assessment  Pt psychosocial assessment reveals pt lives with his wife.  He is retired from [employer] after long years of service.  He enjoys [employer] and going to car shows.  He has not been able to do this now for the past year plus due to his declining health.  He reports his stress level as high, mostly due to watching his wife do everything and he cannot help.  He has had counseling and was placed on antidepressant medication.  He states he is able to handle stress more effectively at this time. His signs of depression at home according to he and his wife are just staring at the floor, staying in bed most of the day.  He states she has been very supportive and does so much for him. His mood today is pleasant and he seems positive about starting exercising and has set goals to improve his QOL and to spend more quality time with his wife and family.  His family and his wife's family also are very supportive. Memory Argue emotional support and reassurance. Monitor and evaluate progress toward psychosocial goals.   Goal(s): Improved management of stress,depression, anxiety Improved coping skills Help patient work toward returning to meaningful activities that improve patient's QOL and are attainable with patient's lung disease   Anthony Olden RN       10/31/2013 2:57 PM

## 2013-10-31 NOTE — Progress Notes (Addendum)
Anthony Petersen arrived today via wheelchair accompanied by his wife for orientation to Pulmonary Rehab.  Neatly dressed, obese gentleman, color good, skin warm and dry, does not appear dyspneic at rest.    He is using oxygen @ 3 L per nasal canula.  Oriented to person, time and place.  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 He has diplopia right eye and is wearing a patch over that eye.  Medications and health history reviewed with patient and wife. He has good grip strength and walks fairly steady with his cane.Marland KitchenHeart rate is regular today, they state history of atrial fibrillation.  Breath sounds distant, decreased in LLL.  He has had surgery for circulation issues in left leg, distal pulses or faint bilateral. He is being followed by his MD for blockage in his right leg also. He regularly checks his CBG and is compliant with medication. Discussed department expectations and obtainable goals set for his Pulmonary Rehab Program Safety and hand hygiene in the exercise area reviewed with patient.  Patient voices understanding.Demonstration and practice of PLB using pulse oximeter.  Patient able to return demonstration satisfactorily.  He plans to start exercise on 11/13/13.  We look forward to assisting this nice gentleman with improvement in his QOL.    Phyllis Keslyn Teater 10:40 am to 12:15 pm

## 2013-11-05 ENCOUNTER — Ambulatory Visit (HOSPITAL_COMMUNITY): Payer: Medicare Other

## 2013-11-06 ENCOUNTER — Encounter (HOSPITAL_COMMUNITY): Payer: Self-pay

## 2013-11-06 NOTE — Progress Notes (Unsigned)
[facility] [place] Pulmonary Rehabilitation Baseline Outcomes Assessment   Anthropometrics:    Height (inches): ***   Weight (kg): ***   Grip strength was measured using a Dynamometer.  The patient's highest score was a ***.  Functional Status/Exercise Capacity:   Graham had a resting heart rate of *** BPM, a resting blood pressure of ***, and an oxygen saturation of *** % on *** liters of O2.  Brien performed a 6-minute walk test on ***.  The patient completed *** feet in 6 minutes with *** rest breaks.  This quantifies *** METS.   Dyspnea Measures:   The [facility] is a simple and standardized method of classifying disability in patients with COPD.  The assessment correlates disability and dyspnea.  At entrance the patient scored a ***. The scale is provided below.   0= I only get breathless with strenuous exercise. 1= I get short of breath when hurrying on level ground or walking up a slight incline. 2= On level ground, I walk slower than people of the same age because of breathlessness, or have to stop for breath when walking at my own pace. 3= I stop for breath after walking 100 yards or after a few minutes on level ground. 4=I am too breathless to leave the house or I am breathless when dressing.     The patient completed the [facility] Shortness Of Breath Questionnaire ([facility] [place]).  This questionnaire relates activities of daily living and shortness of breath.  The score ranges from 0-120, a higher score relates to severe shortness of breath during activities of daily living. The patient's score at entrance was ***.  Quality of Life:   Ferrans and Powers Quality of Life Index Pulmonary Version is used to assess the patients satisfaction in different domains of their life; health and functioning, socioeconomic, psychological/spiritual, and family. The overall score is recorded out of 30 points.  The patient's goal is to achieve an overall score of 21  or higher.  Rodgers received a *** at entrance.    The Patient Health Questionnaire (PHQ-2) is a first step approach for the screening of depression.  If the patient scores positive on the PHQ-2 the patient should be further assessed with the PHQ-9.  The Patient Health Questionnaire (PHQ-9) assesses the degree of depression.  Depression is important to monitor and track in pulmonary patients due to its prevalence in the population.  If the patient advances to the PHQ-9 the goal is to score less than 4 on this assessment.  Abigail scored a *** at entrance.  Clinical Assessment Tools:   The COPD Assessment Test (CAT) is a measurement tool to quantify how much of an impact the disease has on the patient's life.  This assessment aids the Pulmonary Rehab Team in designing the patients individualized treatment plan.  A CAT score ranges from 0-40.  A score of 10 or below indicates that COPD has a low impact on the patient's life whereas a score of 30 or higher indicates a severe impact. The patient's goal is a decrease of 1 point from entrance to discharge.  Rhoderick had a CAT score of *** at entrance.  Nutrition:   The "Rate My Plate" is a dietary assessment that quantifies the balance of a patient's diet.  This tool allows the Pulmonary Rehab Team to key in on the areas of the patient's diet that needs improving.  The team can then focus their nutritional education on those areas.  If the patient scores 24-40, this means there are many ways they can make their eating habits healthier, 41-57 states that there are some ways they can make their eating habits healthier and a score of 58-72 states that they are making many healthy choices.  The patient's goal is to achieve a score of 49 or higher on this assessment.  Brennyn scored a *** at entrance.  Oxygen Compliance:   Patient is currently on *** liters at rest, *** liters at night, and *** liters for exercise.  Braylynn is currently using a *** cpap/bipap at night.   The patient is currently *** compliant/noncompliant.  The patient states that they *** do/do not have barriers that keep them from using their oxygen.  These barriers include ***.   Education:   Jessiah will attend education classes during the course of Pulmonary Rehab.  Education classes that will be offered to the patient are Activities of Daily Living and Energy Conservation, Pursed Lip Breathing and Diaphragmatic Breathing, Nutrition, Exercise for the Pulmonary Patient, Warning Signs of Infection, Chronic Lung Disease, Advanced Directives, Medications, and Stress and Meditation.  The patient completed an assessment at the entrance of the program and will complete it again upon discharge to demonstrate the level of understanding provided by the educational classes.  This assessment includes 14 questions regarding all of the education topics above.  Joniel achieved a score of ***/14 at entrance.  Smoking Cessation:  ***  Exercise:   Camron will be provided with an individualized Home Exercise Prescription (HEP) at the entrance of the program.  The patient will be followed by the Pulmonary Exercise Physiologist throughout the program to assist with the progression of the frequency, intensity, time, and type of exercise. The patient's long-term goal is to be exercising 30-60 minutes, 3-5 days per week. At entrance, the patient was exercising *** days at home.

## 2013-11-12 ENCOUNTER — Encounter (HOSPITAL_COMMUNITY): Payer: Self-pay

## 2013-11-12 NOTE — Progress Notes (Signed)
[facility] [place] Pulmonary Rehabilitation Baseline Outcomes Assessment   Anthropometrics:    Height (inches): 70.75   Weight (kg): 107.0   Grip strength was measured using a Dynamometer.  The patient's highest score was a 46.  Functional Status/Exercise Capacity:   Anthony Petersen had a resting heart rate of 72 BPM, a resting blood pressure of 138/66, and an oxygen saturation of 98 % on 3 liters of O2.  Anthony Petersen performed a 6-minute walk test on 11/05/13.  The patient completed 691 feet in 6 minutes with 0 rest breaks.  This quantifies 2.0 METS.   Dyspnea Measures:   The [facility] is a simple and standardized method of classifying disability in patients with COPD.  The assessment correlates disability and dyspnea.  At entrance the patient scored a 4. The scale is provided below.   0= I only get breathless with strenuous exercise. 1= I get short of breath when hurrying on level ground or walking up a slight incline. 2= On level ground, I walk slower than people of the same age because of breathlessness, or have to stop for breath when walking at my own pace. 3= I stop for breath after walking 100 yards or after a few minutes on level ground. 4=I am too breathless to leave the house or I am breathless when dressing.     The patient completed the [facility] Shortness Of Breath Questionnaire ([facility]).  This questionnaire relates activities of daily living and shortness of breath.  The score ranges from 0-120, a higher score relates to severe shortness of breath during activities of daily living. The patient's score at entrance was 117.  Quality of Life:   Anthony Petersen and Powers Quality of Life Index Pulmonary Version is used to assess the patients satisfaction in different domains of their life; health and functioning, socioeconomic, psychological/spiritual, and family. The overall score is recorded out of 30 points.  The patient's goal is to achieve an overall score of  21 or higher.  Anthony Petersen received a 9.09 at entrance.    The Patient Health Questionnaire (PHQ-2) is a first step approach for the screening of depression.  If the patient scores positive on the PHQ-2 the patient should be further assessed with the PHQ-9.  The Patient Health Questionnaire (PHQ-9) assesses the degree of depression.  Depression is important to monitor and track in pulmonary patients due to its prevalence in the population.  If the patient advances to the PHQ-9 the goal is to score less than 4 on this assessment.  Anthony Petersen scored a 5 on the PHQ-2 at entrance and a 13 on the PHQ-9 at entrance.  Clinical Assessment Tools:   The COPD Assessment Test (CAT) is a measurement tool to quantify how much of an impact the disease has on the patient's life.  This assessment aids the Pulmonary Rehab Team in designing the patients individualized treatment plan.  A CAT score ranges from 0-40.  A score of 10 or below indicates that COPD has a low impact on the patient's life whereas a score of 30 or higher indicates a severe impact. The patient's goal is a decrease of 1 point from entrance to discharge.  Anthony Petersen had a CAT score of 32 at entrance.  Nutrition:   The "Rate My Plate" is a dietary assessment that quantifies the balance of a patient's diet.  This tool allows the Pulmonary Rehab Team to key in on the areas of the patient's diet that needs improving.  The team can then focus their nutritional education on those areas.  If the patient scores 24-40, this means there are many ways they can make their eating habits healthier, 41-57 states that there are some ways they can make their eating habits healthier and a score of 58-72 states that they are making many healthy choices.  The patient's goal is to achieve a score of 49 or higher on this assessment.  Anthony Petersen scored a 43 at entrance.  Oxygen Compliance:   Patient is currently on 3l liters at rest, 3l liters at night, and 4l liters for exercise.  Anthony Petersen is  currently using a cpap at night.  The patient is currently compliant. The patient states that they do not have barriers that keep them from using their oxygen.   Education:   Anthony Petersen will attend education classes during the course of Pulmonary Rehab.  Education classes that will be offered to the patient are Activities of Daily Living and Energy Conservation, Pursed Lip Breathing and Diaphragmatic Breathing, Nutrition, Exercise for the Pulmonary Patient, Warning Signs of Infection, Chronic Lung Disease, Advanced Directives, Medications, and Stress and Meditation.  The patient completed an assessment at the entrance of the program and will complete it again upon discharge to demonstrate the level of understanding provided by the educational classes.  This assessment includes 14 questions regarding all of the education topics above.  Anthony Petersen achieved a score of 8/14 at entrance.  Smoking Cessation:  The patient is not currently smoking.   Exercise:   Anthony Petersen will be provided with an individualized Home Exercise Prescription (HEP) at the entrance of the program.  The patient will be followed by the Pulmonary Exercise Physiologist throughout the program to assist with the progression of the frequency, intensity, time, and type of exercise. The patient's long-term goal is to be exercising 30-60 minutes, 3-5 days per week. At entrance, the patient was exercising 0 days at home.

## 2013-11-13 ENCOUNTER — Encounter (HOSPITAL_COMMUNITY)
Admission: RE | Admit: 2013-11-13 | Discharge: 2013-11-13 | Disposition: A | Discharge status: Home / Self Care | Payer: Medicare Other | Source: Ambulatory Visit | Attending: Internal Medicine | Admitting: Internal Medicine

## 2013-11-13 DIAGNOSIS — J449 Chronic obstructive pulmonary disease, unspecified: Secondary | ICD-10-CM | POA: Insufficient documentation

## 2013-11-13 DIAGNOSIS — Z5189 Encounter for other specified aftercare: Secondary | ICD-10-CM | POA: Insufficient documentation

## 2013-11-13 DIAGNOSIS — J4489 Other specified chronic obstructive pulmonary disease: Secondary | ICD-10-CM | POA: Insufficient documentation

## 2013-11-13 NOTE — Progress Notes (Signed)
First day of exercise in Pulmonary Rehab.  Oriented to equipment use and safety.  Demonstration and practice of PLB on each exercise station.  Tolerated well, oxygen levels 97-99% on 4L [place].  He is a little unsteady walking and pulling oxygen tank.  We will use wheel chair assist.

## 2013-11-15 ENCOUNTER — Encounter (HOSPITAL_COMMUNITY)
Admission: RE | Admit: 2013-11-15 | Discharge: 2013-11-15 | Disposition: A | Discharge status: Home / Self Care | Payer: Medicare Other | Source: Ambulatory Visit | Attending: Internal Medicine | Admitting: Internal Medicine

## 2013-11-20 ENCOUNTER — Encounter (HOSPITAL_COMMUNITY)
Admission: RE | Admit: 2013-11-20 | Discharge: 2013-11-20 | Disposition: A | Discharge status: Home / Self Care | Payer: Medicare Other | Source: Ambulatory Visit | Attending: Internal Medicine | Admitting: Internal Medicine

## 2013-11-22 ENCOUNTER — Encounter (HOSPITAL_COMMUNITY): Payer: Medicare Other

## 2013-11-27 ENCOUNTER — Encounter (HOSPITAL_COMMUNITY)
Admission: RE | Admit: 2013-11-27 | Discharge: 2013-11-27 | Disposition: A | Discharge status: Home / Self Care | Payer: Medicare Other | Source: Ambulatory Visit | Attending: Internal Medicine | Admitting: Internal Medicine

## 2013-11-29 ENCOUNTER — Encounter (HOSPITAL_COMMUNITY)
Admission: RE | Admit: 2013-11-29 | Discharge: 2013-11-29 | Disposition: A | Discharge status: Home / Self Care | Payer: Medicare Other | Source: Ambulatory Visit | Attending: Internal Medicine | Admitting: Internal Medicine

## 2013-11-29 NOTE — Progress Notes (Addendum)
Anthony Petersen 67 y.o. male  30 day Psychosocial Note         Follow up Psychosocial assessment reveals no change in his living status.  His wife continues to be very supportive.  He feels he has not made significant progress in Pulmonary Rehab due to irregular attendance.  He has had frequent MD visit that has  Interfered with attendance. He continues to rate his stress level as high, mostly due to lack of vision and that he cannot help his wife do daily household chores.  He states he cannot do anything outside. Also the upcoming Holiday season is stressful because he cannot participate due to his health.   He recently had a change of   medication for depression, feels previous medicine was not helping.  He has not taken present prescription long enough to note any change.  He is pleasant and interactive with other patients when he attends class. We discussed adding a goal of one  household chore at a time until he feels he is contributing more.  He will concentrate on exercises here that promote improvement for those particular household activities. We will continue to monitor and offer emotional support.   Goal(s) in progress: Improved management of stress,depression, anxiety Improved coping skills Help patient work toward returning to meaningful activities that improve patient's QOL and are attainable with patient's lung disease

## 2013-11-29 NOTE — Progress Notes (Signed)
Anthony Petersen 67 y.o. male Nutrition Note Spoke with pt. Pt well-known to this Clinical research associate from previous admission. Pt is obese. There are some ways the pt can make his eating habits healthier.  Pt's Rate Your Plate results reviewed with pt.  Pt expressed understanding.  Pt does not avoid salty food; uses canned/ convenience food.  Pt adds saltto food.  The role of sodium in lung disease reviewed with pt.  Pt is diabetic. Pt checks CBG's 4 times a day.  Fasting CBG's reportedly 110 -130 mg/dL on average. Pt reports fasting CBG this morning was "200 and something because I ate cereal last night before bed." According to pt, his last A1c was 7.5, which is up from his previous self-reported A1c of 7.0. Pt states he is aware of the changes he needs to make/ "should make." Pt willing to try and eat more fish and decrease poultry eaten with the skin on. Pt expressed understanding of the information reviewed. Nutrition Diagnosis   Excessive sodium intake related to over consumption of processed food as evidenced by frequent consumption of convenience food/ canned vegetables.   Food-and nutrition-related knowledge deficit related to lack of exposure to information as related to diagnosis of pulmonary disease   Obesity related to excessive energy intake as evidenced by a BMI of 33.1 Nutrition Rx/Est. Daily Nutrition Needs for: ? wt loss 1800-2300 Kcal  85-105 gm protein   1500 mg or less sodium     250 gm CHO Nutrition Intervention   Pt's individual nutrition plan and goals reviewed with pt.   Benefits of adopting healthy eating habits discussed when pt's Rate Your Plate reviewed.   Pt to attend the Nutrition and Lung Disease class   Continual client-centered nutrition education by RD, as part of interdisciplinary care. Goal(s) 1. Pt to identify and limit food sources of sodium. 2. Identify food quantities necessary to achieve wt loss of  -2# per week to a goal wt of 96.1-104.3 kg (211-229 lb) at graduation  from pulmonary rehab. 3. CBG's in the normal range or as close to normal as is safely possible. Monitor and Evaluate progress toward nutrition goal with team.   Mickle Plumb, M.Ed, RD, LDN, CDE 11/29/2013 12:34 PM

## 2013-12-04 ENCOUNTER — Encounter (HOSPITAL_COMMUNITY)
Admission: RE | Admit: 2013-12-04 | Discharge: 2013-12-04 | Disposition: A | Discharge status: Home / Self Care | Payer: Medicare Other | Source: Ambulatory Visit | Attending: Internal Medicine | Admitting: Internal Medicine

## 2013-12-06 ENCOUNTER — Encounter (HOSPITAL_COMMUNITY): Payer: Medicare Other

## 2013-12-11 ENCOUNTER — Encounter (HOSPITAL_COMMUNITY): Payer: Medicare Other

## 2013-12-13 ENCOUNTER — Encounter (HOSPITAL_COMMUNITY): Payer: Medicare Other

## 2013-12-13 DIAGNOSIS — J449 Chronic obstructive pulmonary disease, unspecified: Secondary | ICD-10-CM | POA: Insufficient documentation

## 2013-12-13 DIAGNOSIS — J4489 Other specified chronic obstructive pulmonary disease: Secondary | ICD-10-CM | POA: Insufficient documentation

## 2013-12-13 DIAGNOSIS — Z5189 Encounter for other specified aftercare: Secondary | ICD-10-CM | POA: Insufficient documentation

## 2013-12-18 ENCOUNTER — Encounter (HOSPITAL_COMMUNITY): Payer: Medicare Other

## 2013-12-20 ENCOUNTER — Encounter (HOSPITAL_COMMUNITY): Payer: Medicare Other

## 2013-12-25 ENCOUNTER — Encounter (HOSPITAL_COMMUNITY)
Admission: RE | Admit: 2013-12-25 | Discharge: 2013-12-25 | Disposition: A | Discharge status: Home / Self Care | Payer: Medicare Other | Source: Ambulatory Visit | Attending: Internal Medicine | Admitting: Internal Medicine

## 2013-12-25 NOTE — Progress Notes (Signed)
Anthony Petersen 68 y.o. male 87 day  Psychosocial Note  Patient psychosocial assessment reveals  barriers to participation in Pulmonary Rehab. Psychosocial areas that are currently affecting patient's rehab experience include that he has been sick with a sinus infection, an eye irritation, and a foot problem.  Because of this patient has only attended 6 exercise sessions.   His family is supportive of patient attending the program.   Patient does continue to exhibit positive coping skills to deal with his psychosocial concerns. Offered emotional support and reassurance. Patient does not feel he is making progress toward Pulmonary Rehab goals due to his erratic attendance.  He is in hopes his attendance will be more regular since his health issues have resolved presently.   Patient reports his health and activity level have not improved in the past 30 days as evidenced by patient's report of no significant change in ability to perform household chores.  Patient states family/friends have not noticed changes in his activity or mood. Patient reports feeling positive about current and projected progression in Pulmonary Rehab.   After reviewing the patient's treatment plan, the patient is making progress toward Pulmonary Rehab goals albeit slow. Patient's rate of progress toward rehab goals is fair. Plan of action to help patient continue to work towards rehab goals include increasing workloads on equipment as tolerated, and reinforce purse-lip breathing. Will continue to monitor and evaluate progress toward psychosocial goal(s).    Goal(s) in progress: Increased stamina and endurance  Ability to practice purse-lip breathing without prompting  Help patient work toward returning to meaningful activities that improve patient's QOL and are attainable with patient's lung disease

## 2013-12-27 ENCOUNTER — Encounter (HOSPITAL_COMMUNITY)
Admission: RE | Admit: 2013-12-27 | Discharge: 2013-12-27 | Disposition: A | Discharge status: Home / Self Care | Payer: Medicare Other | Source: Ambulatory Visit | Attending: Internal Medicine | Admitting: Internal Medicine

## 2013-12-27 ENCOUNTER — Encounter (HOSPITAL_COMMUNITY): Payer: Self-pay

## 2013-12-27 NOTE — Progress Notes (Signed)
I have reviewed a Home Exercise Prescription with [name] . Anthony Petersen is not currently exercising at home.  The patient was advised to walk 2 days a week for 10 minutes.  [name] and I discussed how to progress their exercise prescription.  The patient states that he has a good loop around his house that he can walk and he is going to look into going to the church with his wife.  He states that his church has an indoor track and exercise equipment.  The patient stated that their goals were breathe better ad be able to do some yard work.  The patient stated that they understand the exercise prescription.  We reviewed exercise guidelines, target heart rate during exercise, oxygen use, weather, home pulse oximeter, endpoints for exercise, and goals.  Patient is encouraged to come to me with any questions. I will continue to follow up with the patient to assist them with progression and safety.

## 2014-01-01 ENCOUNTER — Encounter (HOSPITAL_COMMUNITY)
Admission: RE | Admit: 2014-01-01 | Discharge: 2014-01-01 | Disposition: A | Discharge status: Home / Self Care | Payer: Medicare Other | Source: Ambulatory Visit | Attending: Internal Medicine | Admitting: Internal Medicine

## 2014-01-03 ENCOUNTER — Encounter (HOSPITAL_COMMUNITY)
Admission: RE | Admit: 2014-01-03 | Discharge: 2014-01-03 | Disposition: A | Discharge status: Home / Self Care | Payer: Medicare Other | Source: Ambulatory Visit | Attending: Internal Medicine | Admitting: Internal Medicine

## 2014-01-08 ENCOUNTER — Encounter (HOSPITAL_COMMUNITY)
Admission: RE | Admit: 2014-01-08 | Discharge: 2014-01-08 | Disposition: A | Discharge status: Home / Self Care | Payer: Medicare Other | Source: Ambulatory Visit | Attending: Internal Medicine | Admitting: Internal Medicine

## 2014-01-10 ENCOUNTER — Encounter (HOSPITAL_COMMUNITY): Payer: Medicare Other

## 2014-01-15 ENCOUNTER — Encounter (HOSPITAL_COMMUNITY)
Admission: RE | Admit: 2014-01-15 | Discharge: 2014-01-15 | Disposition: A | Discharge status: Home / Self Care | Payer: Medicare Other | Source: Ambulatory Visit | Attending: Internal Medicine | Admitting: Internal Medicine

## 2014-01-15 DIAGNOSIS — Z5189 Encounter for other specified aftercare: Secondary | ICD-10-CM | POA: Insufficient documentation

## 2014-01-15 DIAGNOSIS — J4489 Other specified chronic obstructive pulmonary disease: Secondary | ICD-10-CM | POA: Insufficient documentation

## 2014-01-15 DIAGNOSIS — J449 Chronic obstructive pulmonary disease, unspecified: Secondary | ICD-10-CM | POA: Insufficient documentation

## 2014-01-17 ENCOUNTER — Encounter (HOSPITAL_COMMUNITY)
Admission: RE | Admit: 2014-01-17 | Discharge: 2014-01-17 | Disposition: A | Discharge status: Home / Self Care | Payer: Medicare Other | Source: Ambulatory Visit | Attending: Internal Medicine | Admitting: Internal Medicine

## 2014-01-22 ENCOUNTER — Encounter (HOSPITAL_COMMUNITY)
Admission: RE | Admit: 2014-01-22 | Discharge: 2014-01-22 | Disposition: A | Discharge status: Home / Self Care | Payer: Medicare Other | Source: Ambulatory Visit | Attending: Internal Medicine | Admitting: Internal Medicine

## 2014-01-22 NOTE — Psychosocial Assessment (Signed)
Anthony Petersen 68 y.o. male  34 day  Psychosocial Note  Patient psychosocial assessment reveals no barriers to participation in Pulmonary Rehab.  Patient does continue to exhibit positive coping skills to deal with his lung disease.  Offered emotional support and reassurance. Patient does feel he is making progress toward Pulmonary Rehab goals. Patient reports his health and activity level has improved in the past 30 days as evidenced by patient's report of increased ability to tinker in his shop, play with his grandchildren, and do more chores around the house. Patient states family/friends have noticed changes in his activity or mood for the good. Patient reports feeling positive about current and projected progression in Pulmonary Rehab. After reviewing the patient's treatment plan, the patient is making progress toward Pulmonary Rehab goals. Patient's rate of progress toward rehab goals is good. Patient has made more progress in this last 30 days than before due to his improved attendance and he has not been ill.  Plan of action to help patient continue to work towards rehab goals include increasing exercise workloads as appropriate. Will continue to monitor and evaluate progress toward psychosocial goal(s).  Goal(s) in progress: Continue to increase exercise workloads as appropriate Increase awareness and importance of purse-lip breathing and diaphragmatic breathing Help patient work toward returning to meaningful activities that improve patient's QOL and are attainable with patient's lung disease

## 2014-01-24 ENCOUNTER — Encounter (HOSPITAL_COMMUNITY)
Admission: RE | Admit: 2014-01-24 | Discharge: 2014-01-24 | Disposition: A | Discharge status: Home / Self Care | Payer: Medicare Other | Source: Ambulatory Visit | Attending: Internal Medicine | Admitting: Internal Medicine

## 2014-01-25 NOTE — Psychosocial Assessment (Signed)
Staff MD, Rehab Director Note/Attestation  Reviewed pshyosocial assessment. Agree with goal  Laid out by the staff of rehab  Dr. Shuaib Corsino, M.D., F.C.C.P Pulmonary and Critical Care Medicine Staff Physician and Director of Pulmonary Rehabilitation Services [facility] and Critical Care Pager: [phone], If no answer or between  15:00h - 7:00h: call [phone]  01/15/2014 9:10 PM  

## 2014-01-29 ENCOUNTER — Encounter (HOSPITAL_COMMUNITY): Payer: Medicare Other

## 2014-01-31 ENCOUNTER — Encounter (HOSPITAL_COMMUNITY): Payer: Medicare Other

## 2014-02-05 ENCOUNTER — Encounter (HOSPITAL_COMMUNITY): Payer: Medicare Other

## 2014-02-07 ENCOUNTER — Encounter (HOSPITAL_COMMUNITY): Payer: Medicare Other

## 2014-02-12 ENCOUNTER — Encounter (HOSPITAL_COMMUNITY)
Admission: RE | Admit: 2014-02-12 | Discharge: 2014-02-12 | Disposition: A | Discharge status: Home / Self Care | Payer: Medicare Other | Source: Ambulatory Visit | Attending: Internal Medicine | Admitting: Internal Medicine

## 2014-02-12 DIAGNOSIS — J4489 Other specified chronic obstructive pulmonary disease: Secondary | ICD-10-CM | POA: Insufficient documentation

## 2014-02-12 DIAGNOSIS — J449 Chronic obstructive pulmonary disease, unspecified: Secondary | ICD-10-CM | POA: Insufficient documentation

## 2014-02-12 DIAGNOSIS — Z5189 Encounter for other specified aftercare: Secondary | ICD-10-CM | POA: Insufficient documentation

## 2014-02-14 ENCOUNTER — Encounter (HOSPITAL_COMMUNITY)
Admission: RE | Admit: 2014-02-14 | Discharge: 2014-02-14 | Disposition: A | Discharge status: Home / Self Care | Payer: Medicare Other | Source: Ambulatory Visit | Attending: Internal Medicine | Admitting: Internal Medicine

## 2014-02-19 ENCOUNTER — Encounter (HOSPITAL_COMMUNITY)
Admission: RE | Admit: 2014-02-19 | Discharge: 2014-02-19 | Disposition: A | Discharge status: Home / Self Care | Payer: Medicare Other | Source: Ambulatory Visit | Attending: Internal Medicine | Admitting: Internal Medicine

## 2014-02-19 NOTE — Psychosocial Assessment (Signed)
Staff MD, Rehab Director Note/Attestation  Reviewed pshyosocial assessment. Agree with goal  Laid out by the staff of rehab  Dr. Brand Males, M.D., [facility].C.P Pulmonary and Critical Care Medicine Staff Physician and Director of [place] [facility] and Critical [facility] Pager: [phone], If no answer or between  15:00h - 7:00h: call [phone]  02/19/2014 4:41 PM

## 2014-02-19 NOTE — Psychosocial Assessment (Signed)
Anthony Petersen 68 y.o. male 120 day Psychosocial Note  Patient psychosocial assessment reveals no barriers to participation in Pulmonary Rehab.  Patient does feel he is making progress toward Pulmonary Rehab goals. Patient reports his health and activity level has improved in the past 30 days as evidenced by patient's report of increased ability to play and work in his shop and do some light yardwork.  Patient seems to enjoy coming to class to exercise and is very proactive in checking his CBG before class and after without being prompted.  He is able to maintain his CBG's in a safe range while exercising.   Patient states family/friends have not voiced that they have noticed changes in his activity or mood. Patient reports  feeling positive about current and projected progression in Pulmonary Rehab. After reviewing the patient's treatment plan, the patient is making progress toward Pulmonary Rehab goals. Patient's rate of progress toward rehab goals is excellent. Plan of action to help patient continue to work towards rehab goals include increasing his workloads as appropriate on the exercise equipment. Will continue to monitor and evaluate progress toward psychosocial goal(s).  Goal(s) in progress: For patient to continue exercising after he graduates from the pulmonary rehab program For patient to maintain or increase his stamina and endurance after graduating Help patient work toward returning to meaningful activities that improve patient's QOL and are attainable with patient's lung disease

## 2014-02-21 ENCOUNTER — Encounter (HOSPITAL_COMMUNITY)
Admission: RE | Admit: 2014-02-21 | Discharge: 2014-02-21 | Disposition: A | Discharge status: Home / Self Care | Payer: Medicare Other | Source: Ambulatory Visit | Attending: Internal Medicine | Admitting: Internal Medicine

## 2014-02-26 ENCOUNTER — Encounter (HOSPITAL_COMMUNITY): Payer: Self-pay

## 2014-02-26 ENCOUNTER — Encounter (HOSPITAL_COMMUNITY)
Admission: RE | Admit: 2014-02-26 | Discharge: 2014-02-26 | Disposition: A | Discharge status: Home / Self Care | Payer: Medicare Other | Source: Ambulatory Visit | Attending: Internal Medicine | Admitting: Internal Medicine

## 2014-02-26 NOTE — Progress Notes (Signed)
Today, Anthony Petersen exercised at [employer].  The patient exercised for more than 31 minutes on different pieces of equipment. Oxygen saturation, heart rate, blood pressure, rate of perceived exertion, and shortness of breath were all monitored before, during, and after exercise. [name] presented with no problems at today's exercise session.    Dr. Brand Males, Medical Director Dr. Dyann Kief is immediately available during today's Pulmonary Rehab session for Anthony Petersen.

## 2014-02-28 ENCOUNTER — Encounter (HOSPITAL_COMMUNITY): Payer: Self-pay

## 2014-02-28 ENCOUNTER — Encounter (HOSPITAL_COMMUNITY)
Admission: RE | Admit: 2014-02-28 | Discharge: 2014-02-28 | Disposition: A | Discharge status: Home / Self Care | Payer: Medicare Other | Source: Ambulatory Visit | Attending: Internal Medicine | Admitting: Internal Medicine

## 2014-02-28 NOTE — Progress Notes (Signed)
Today, Anthony Petersen exercised at [employer].  The patient exercised for more than 31 minutes on different pieces of equipment. Oxygen saturation, heart rate, blood pressure, rate of perceived exertion, and shortness of breath were all monitored before, during, and after exercise. [name] presented with no problems at today's exercise session.  This patient attended the Pulmonary Rehab MD lecture with Dr. Alva Garnet.    Dr. Brand Males, Medical Director Dr. Aileen Fass is immediately available during today's Pulmonary Rehab session for Anthony Petersen.

## 2014-03-05 ENCOUNTER — Encounter (HOSPITAL_COMMUNITY): Payer: Self-pay

## 2014-03-05 ENCOUNTER — Encounter (HOSPITAL_COMMUNITY)
Admission: RE | Admit: 2014-03-05 | Discharge: 2014-03-05 | Disposition: A | Discharge status: Home / Self Care | Payer: Medicare Other | Source: Ambulatory Visit | Attending: Internal Medicine | Admitting: Internal Medicine

## 2014-03-05 NOTE — Progress Notes (Signed)
Today, Anthony Petersen exercised at [employer]. Service time was from 10:30 to 12:00.  The patient exercised for more than 31 minutes on different pieces of equipment. Oxygen saturation, heart rate, blood pressure, rate of perceived exertion, and shortness of breath were all monitored before, during, and after exercise. [name] presented with no problems at today's exercise session.   Pre-exercise vitals:   Weight: 107.2   Liters of O2: 3   SpO2: 98   HR: 69   BP: 132/64   CGB: 144  Exercise vitals:   Highest heartrate:  84   Lowest oxygen saturation: 93   Highest blood pressure: 120/58   Liters of O2: 3  Post-exercise vitals:   SpO2: 97   HR: 72   BP: 120/54   CGB: 107

## 2014-03-07 ENCOUNTER — Encounter (HOSPITAL_COMMUNITY): Payer: Self-pay

## 2014-03-07 ENCOUNTER — Encounter (HOSPITAL_COMMUNITY)
Admission: RE | Admit: 2014-03-07 | Discharge: 2014-03-07 | Disposition: A | Discharge status: Home / Self Care | Payer: Medicare Other | Source: Ambulatory Visit | Attending: Internal Medicine | Admitting: Internal Medicine

## 2014-03-07 NOTE — Progress Notes (Signed)
Today, [name] exercised at [employer]. Service time was from 10:30 to 12:30.  The patient exercised for more than 31 minutes on different pieces of equipment. Armari also performed strength training for 10 minutes.  Oxygen saturation, heart rate, blood pressure, rate of perceived exertion, and shortness of breath were all monitored before, during, and after exercise. Anthony Petersen presented with no problems at today's exercise session. The patient attended the education class "Nutrition for the Pulmonary Patient" with Derek Mound our Dietician today.  Pre-exercise vitals:   Weight kg: 107.4   Liters of O2: 3   SpO2: 94   HR: 74   BP: 128/58   CGB: 178  Exercise vitals:   Highest heartrate:  83   Lowest oxygen saturation: 96   Highest blood pressure: 138/70   Liters of O2: 3  Post-exercise vitals:   SpO2: 96   HR: 80   BP: 112/54   CGB: 112   Dr. Brand Males, Medical Director Dr. Ree Kida is immediately available during today's Pulmonary Rehab session for [name].

## 2014-03-12 ENCOUNTER — Encounter (HOSPITAL_COMMUNITY)
Admission: RE | Admit: 2014-03-12 | Discharge: 2014-03-12 | Disposition: A | Discharge status: Home / Self Care | Payer: Medicare Other | Source: Ambulatory Visit | Attending: Internal Medicine | Admitting: Internal Medicine

## 2014-03-12 ENCOUNTER — Encounter (HOSPITAL_COMMUNITY): Payer: Self-pay

## 2014-03-12 NOTE — Progress Notes (Signed)
Today, Anthony Petersen exercised at [employer]. Service time was from 10:30 to 12:30.  The patient exercised for more than 31 minutes performing aerobic, strengthening, and stretching exercises. Oxygen saturation, heart rate, blood pressure, rate of perceived exertion, and shortness of breath were all monitored before, during, and after exercise. [name] presented with no problems at today's exercise session.   Pre-exercise vitals:   Weight kg: 107.3   Liters of O2: 3   SpO2: 96   HR: 74   BP: 104/60   CBG: 168  Exercise vitals:   Highest heartrate:  87   Lowest oxygen saturation: 95   Highest blood pressure: 122/60   Liters of 02: 2  Post-exercise vitals:   SpO2: 95   HR: 71   BP: 108/58   Liters of O2: 2   CBG: 114  Dr. Brand Males, Medical Director Dr. Ree Kida is immediately available during today's Pulmonary Rehab session for [name] on 03/12/14 at 10:30am class time.

## 2014-03-14 ENCOUNTER — Encounter (HOSPITAL_COMMUNITY): Payer: Self-pay

## 2014-03-19 ENCOUNTER — Encounter (HOSPITAL_COMMUNITY)
Admission: RE | Admit: 2014-03-19 | Discharge: 2014-03-19 | Disposition: A | Discharge status: Home / Self Care | Payer: Self-pay | Source: Ambulatory Visit | Attending: Internal Medicine | Admitting: Internal Medicine

## 2014-03-19 DIAGNOSIS — Z5189 Encounter for other specified aftercare: Secondary | ICD-10-CM | POA: Insufficient documentation

## 2014-03-19 DIAGNOSIS — J4489 Other specified chronic obstructive pulmonary disease: Secondary | ICD-10-CM | POA: Insufficient documentation

## 2014-03-19 DIAGNOSIS — J449 Chronic obstructive pulmonary disease, unspecified: Secondary | ICD-10-CM | POA: Insufficient documentation

## 2014-03-19 NOTE — Psychosocial Assessment (Signed)
Anthony Petersen 68 y.o. male  150 day and Final Psychosocial Note  Patient psychosocial assessment reveals no barriers to participation in Pulmonary Rehab. Patient does feel he has made progress toward Pulmonary Rehab goals. Patient reports his health and activity level has improved in the past 30 days as evidenced by patient's report of increased ability to have the energy to "piddle in the yard and his shop".  His CBG's have much improved while in the program.  He seems to be more aware of the need to eat correctly and check his CBG and take his diabetes medications.  His mental attitude and confidence seem to have improved while attending the program as well.   Patient states family/friends have noticed changes in his activity or mood. Patient reports feeling positive about the progress he has made in Pulmonary Rehab.  Next week will be his last week in the program unless he has an absence.  He has eye surgery this week and will not be able to participate.  He is debating whether to enroll in the pulmonary maintenance program.   After reviewing the patient's treatment plan, the patient has made progress toward Pulmonary Rehab goals. Patient's rate of progress toward rehab goals is excellent. Plan of action to help patient continue to work towards rehab goals include giving the patient guidelines to continue his exercise at home along with offering enrollment into the pulmonary maintenance program.  Goals completed: Ability to correctly purse-lip breathe Knowledge of warning signs and symptoms and when to call the doctor  Knowledge of how to exercise safely at home  Improved quality of life

## 2014-03-21 ENCOUNTER — Encounter (HOSPITAL_COMMUNITY): Payer: Self-pay

## 2014-03-26 ENCOUNTER — Encounter (HOSPITAL_COMMUNITY): Payer: Self-pay

## 2014-03-27 NOTE — Psychosocial Assessment (Signed)
Staff MD, Rehab Director Note/Attestation  Reviewed pshyosocial assessment. Agree with goal  Laid out by the staff of rehab. He is completing his rehab and is much improved physically and mentally  Dr. Brand Males, M.D., [facility].C.P Pulmonary and Critical Care Medicine Staff Physician and Director of [place] [facility] Pager: [phone], If no answer or between  15:00h - 7:00h: call [phone]  03/27/2014 8:40 AM

## 2014-03-28 ENCOUNTER — Encounter (HOSPITAL_COMMUNITY): Payer: Self-pay

## 2014-04-02 ENCOUNTER — Encounter (HOSPITAL_COMMUNITY): Payer: Self-pay

## 2014-04-02 ENCOUNTER — Encounter (HOSPITAL_COMMUNITY)
Admission: RE | Admit: 2014-04-02 | Discharge: 2014-04-02 | Disposition: A | Discharge status: Home / Self Care | Payer: Self-pay | Source: Ambulatory Visit | Attending: Internal Medicine | Admitting: Internal Medicine

## 2014-04-02 NOTE — Progress Notes (Signed)
Will completed a Six-Minute Walk Test on 04/02/14 . Anthony Petersen walked 859 feet with 0 breaks.  The patient's lowest oxygen saturation was 90 , highest heart rate was 94 , and highest blood pressure was 152/64. The patient was on 3 liters. Kalel stated that leg fatigue hindered their walk test.

## 2014-04-04 ENCOUNTER — Encounter (HOSPITAL_COMMUNITY): Payer: Self-pay

## 2014-04-04 NOTE — Outcomes Assessment (Signed)
The . [facility] Pulmonary Rehabilitation Final/Discharge Outcome Results  Anthropometrics:   Height (inches): 70.75   Weight (kg): 107.2kg   This is a change of +.2kg from entrance.   Grip strength was measured using a Dynamometer.  The patient's discharge score was a 44.     This is a change of -2 from entrance.  Functional Status/Exercise Capacity:   Kanav had a resting heart rate of 76 BPM, a resting blood pressure of 116/60, and an oxygen saturation of 98 % on 3 liters of O2.  [name] performed a discharge 6-minute walk test on 04/02/14.  The patient completed 859 feet in 6 minutes with 0 rest breaks.  This quantifies 2.23 METS. The patient's goal is to add 82 feet onto the baseline 6MWT.     The patient increased their 6-minute walk test distance by +168 feet and their MET level by +.23 METs.  Dyspnea Measures:   The [facility] is a simple and standardized method of classifying disability in patients with COPD.  The assessment correlates disability and dyspnea.  Upon discharge the patients resting score was 2-3. The scale is provided below.    This is a change of - 1-2 from entrance.  0= I only get breathless with strenuous exercise. 1= I get short of breath when hurrying on level ground or walking up a slight incline. 2= On level ground, I walk slower than people of the same age because of breathlessness, or have to stop for breath when walking at my own pace. 3= I stop for breath after walking 100 yards or after a few minutes on level ground. 4=I am too breathless to leave the house or I am breathless when dressing.     The patient completed the [place] ([facility] [place]).  This questionnaire relates activities of daily living and shortness of breath.  The score ranges from 0-120, a higher score relates to severe shortness of breath during activities of daily living. The patient's score at discharge was 97.   This is a  change of -20 from entrance.  Quality of Life:   Ferrans and Powers Quality of Life Index Pulmonary Version is used to assess the patients satisfaction in different domains of their life; health and functioning, socioeconomic, psychological/spiritual, and family. The overall score is recorded out of 30 points.  The patient's goal is to achieve an overall score of 21 or higher.  [name] received a 11.93 upon discharge.    This is a change of +2.84 from entrance.   Clinical Assessment Tools:   The COPD Assessment Test (CAT) is a measurement tool to quantify how much of an impact the disease has on the patient's life.  This assessment aids the Pulmonary Rehab Team in designing the patients individualized treatment plan.  A CAT score ranges from 0-40.  A score of 10 or below indicates that COPD has a low impact on the patient's life whereas a score of 30 or higher indicates a severe impact. The patient's goal is a decrease of 1 point from entrance to discharge.  Mordecai had a CAT score of 24 upon discharge.     This is a change of -8 from entrance.  Nutrition:   The "Rate My Plate" is a dietary assessment that quantifies the balance of a patient's diet.  This tool allows the Pulmonary Rehab Team to key in on the areas of the patient's diet that needs improving.  The team  can then focus their nutritional education on those areas.  If the patient scores 24-40, this means there are many ways they can make their eating habits healthier, 41-57 states that there are some ways they can make their eating habits healthier and a score of 58-72 states that they are making many healthy choices.  The patient's goal is to achieve a score of 49 or higher on this assessment.  [name] scored a 69 upon discharge.     This is a change of +3 from entrance.  Oxygen Compliance:   Patient is currently on 3 liters at rest, 3 liters at night, and 3 liters for exercise.  [name] is currently using cpap at night.  The patient is  currently compliant The patient states that they do not have barriers that keep them from using their oxygen.     This is a no change result from entrance.    Education:   [name] attended all education classes.  August completed a discharge educational assessment and achieved a score of 12/14.    This is a change of +4 from entrance.   Smoking Cessation:  The patient is not currently smoking  Exercise:   [name] was provided with an individualized Home Exercise Prescription (HEP) at the entrance of the program.  The patient's goal is to be exercising 30-60 minutes, 3-5 days per week. Upon discharge the patient is exercising 2 days at home.  This is a change of +2 from entrance.  After graduation from Pulmonary Rehab, [name] will continue exercising in the maintenance program.

## 2014-04-09 ENCOUNTER — Encounter (HOSPITAL_COMMUNITY)
Admission: RE | Admit: 2014-04-09 | Discharge: 2014-04-09 | Disposition: A | Discharge status: Home / Self Care | Payer: Self-pay | Source: Ambulatory Visit | Attending: Internal Medicine | Admitting: Internal Medicine

## 2014-04-09 ENCOUNTER — Encounter (HOSPITAL_COMMUNITY): Payer: Self-pay

## 2014-04-11 ENCOUNTER — Encounter (HOSPITAL_COMMUNITY): Payer: Self-pay

## 2014-04-11 ENCOUNTER — Encounter (HOSPITAL_COMMUNITY)
Admission: RE | Admit: 2014-04-11 | Discharge: 2014-04-11 | Disposition: A | Discharge status: Home / Self Care | Payer: Self-pay | Source: Ambulatory Visit | Attending: Internal Medicine | Admitting: Internal Medicine

## 2014-04-16 ENCOUNTER — Encounter (HOSPITAL_COMMUNITY)
Admission: RE | Admit: 2014-04-16 | Discharge: 2014-04-16 | Disposition: A | Discharge status: Home / Self Care | Payer: Self-pay | Source: Ambulatory Visit | Attending: Internal Medicine | Admitting: Internal Medicine

## 2014-04-16 DIAGNOSIS — Z5189 Encounter for other specified aftercare: Secondary | ICD-10-CM | POA: Insufficient documentation

## 2014-04-16 DIAGNOSIS — J449 Chronic obstructive pulmonary disease, unspecified: Secondary | ICD-10-CM | POA: Insufficient documentation

## 2014-04-16 DIAGNOSIS — J4489 Other specified chronic obstructive pulmonary disease: Secondary | ICD-10-CM | POA: Insufficient documentation

## 2014-04-18 ENCOUNTER — Encounter (HOSPITAL_COMMUNITY): Payer: Self-pay

## 2014-04-23 ENCOUNTER — Encounter (HOSPITAL_COMMUNITY): Payer: Self-pay

## 2014-04-25 ENCOUNTER — Encounter (HOSPITAL_COMMUNITY)
Admission: RE | Admit: 2014-04-25 | Discharge: 2014-04-25 | Disposition: A | Discharge status: Home / Self Care | Payer: Self-pay | Source: Ambulatory Visit | Attending: Internal Medicine | Admitting: Internal Medicine

## 2014-04-30 ENCOUNTER — Encounter (HOSPITAL_COMMUNITY)
Admission: RE | Admit: 2014-04-30 | Discharge: 2014-04-30 | Disposition: A | Discharge status: Home / Self Care | Payer: Self-pay | Source: Ambulatory Visit | Attending: Internal Medicine | Admitting: Internal Medicine

## 2014-05-02 ENCOUNTER — Encounter (HOSPITAL_COMMUNITY)
Admission: RE | Admit: 2014-05-02 | Discharge: 2014-05-02 | Disposition: A | Discharge status: Home / Self Care | Payer: Self-pay | Source: Ambulatory Visit | Attending: Internal Medicine | Admitting: Internal Medicine

## 2014-05-07 ENCOUNTER — Encounter (HOSPITAL_COMMUNITY)
Admission: RE | Admit: 2014-05-07 | Discharge: 2014-05-07 | Disposition: A | Discharge status: Home / Self Care | Payer: Self-pay | Source: Ambulatory Visit | Attending: Internal Medicine | Admitting: Internal Medicine

## 2014-05-09 ENCOUNTER — Encounter (HOSPITAL_COMMUNITY)
Admission: RE | Admit: 2014-05-09 | Discharge: 2014-05-09 | Disposition: A | Discharge status: Home / Self Care | Payer: Self-pay | Source: Ambulatory Visit | Attending: Internal Medicine | Admitting: Internal Medicine

## 2014-05-14 ENCOUNTER — Encounter (HOSPITAL_COMMUNITY)
Admission: RE | Admit: 2014-05-14 | Discharge: 2014-05-14 | Disposition: A | Discharge status: Home / Self Care | Payer: Self-pay | Source: Ambulatory Visit | Attending: Internal Medicine | Admitting: Internal Medicine

## 2014-05-14 DIAGNOSIS — Z5189 Encounter for other specified aftercare: Secondary | ICD-10-CM | POA: Insufficient documentation

## 2014-05-14 DIAGNOSIS — J4489 Other specified chronic obstructive pulmonary disease: Secondary | ICD-10-CM | POA: Insufficient documentation

## 2014-05-14 DIAGNOSIS — J449 Chronic obstructive pulmonary disease, unspecified: Secondary | ICD-10-CM | POA: Insufficient documentation

## 2014-05-16 ENCOUNTER — Encounter (HOSPITAL_COMMUNITY)
Admission: RE | Admit: 2014-05-16 | Discharge: 2014-05-16 | Disposition: A | Discharge status: Home / Self Care | Payer: Self-pay | Source: Ambulatory Visit | Attending: Internal Medicine | Admitting: Internal Medicine

## 2014-05-21 ENCOUNTER — Encounter (HOSPITAL_COMMUNITY): Payer: Self-pay

## 2014-05-23 ENCOUNTER — Encounter (HOSPITAL_COMMUNITY)
Admission: RE | Admit: 2014-05-23 | Discharge: 2014-05-23 | Disposition: A | Discharge status: Home / Self Care | Payer: Self-pay | Source: Ambulatory Visit | Attending: Internal Medicine | Admitting: Internal Medicine

## 2014-05-28 ENCOUNTER — Encounter (HOSPITAL_COMMUNITY)
Admission: RE | Admit: 2014-05-28 | Discharge: 2014-05-28 | Disposition: A | Discharge status: Home / Self Care | Payer: Self-pay | Source: Ambulatory Visit | Attending: Internal Medicine | Admitting: Internal Medicine

## 2014-05-30 ENCOUNTER — Encounter (HOSPITAL_COMMUNITY)
Admission: RE | Admit: 2014-05-30 | Discharge: 2014-05-30 | Disposition: A | Discharge status: Home / Self Care | Payer: Self-pay | Source: Ambulatory Visit | Attending: Internal Medicine | Admitting: Internal Medicine

## 2014-06-04 ENCOUNTER — Encounter (HOSPITAL_COMMUNITY)
Admission: RE | Admit: 2014-06-04 | Discharge: 2014-06-04 | Disposition: A | Discharge status: Home / Self Care | Payer: Self-pay | Source: Ambulatory Visit | Attending: Internal Medicine | Admitting: Internal Medicine

## 2014-06-06 ENCOUNTER — Encounter (HOSPITAL_COMMUNITY)
Admission: RE | Admit: 2014-06-06 | Discharge: 2014-06-06 | Disposition: A | Discharge status: Home / Self Care | Payer: Self-pay | Source: Ambulatory Visit | Attending: Internal Medicine | Admitting: Internal Medicine

## 2014-06-11 ENCOUNTER — Encounter (HOSPITAL_COMMUNITY)
Admission: RE | Admit: 2014-06-11 | Discharge: 2014-06-11 | Disposition: A | Discharge status: Home / Self Care | Payer: Self-pay | Source: Ambulatory Visit | Attending: Internal Medicine | Admitting: Internal Medicine

## 2014-06-13 ENCOUNTER — Encounter (HOSPITAL_COMMUNITY)
Admission: RE | Admit: 2014-06-13 | Discharge: 2014-06-13 | Disposition: A | Discharge status: Home / Self Care | Payer: Self-pay | Source: Ambulatory Visit | Attending: Internal Medicine | Admitting: Internal Medicine

## 2014-06-13 DIAGNOSIS — J4489 Other specified chronic obstructive pulmonary disease: Secondary | ICD-10-CM | POA: Insufficient documentation

## 2014-06-13 DIAGNOSIS — Z5189 Encounter for other specified aftercare: Secondary | ICD-10-CM | POA: Insufficient documentation

## 2014-06-13 DIAGNOSIS — J449 Chronic obstructive pulmonary disease, unspecified: Secondary | ICD-10-CM | POA: Insufficient documentation

## 2014-06-18 ENCOUNTER — Encounter (HOSPITAL_COMMUNITY)
Admission: RE | Admit: 2014-06-18 | Discharge: 2014-06-18 | Disposition: A | Discharge status: Home / Self Care | Payer: Self-pay | Source: Ambulatory Visit | Attending: Internal Medicine | Admitting: Internal Medicine

## 2014-06-20 ENCOUNTER — Encounter (HOSPITAL_COMMUNITY)
Admission: RE | Admit: 2014-06-20 | Discharge: 2014-06-20 | Disposition: A | Discharge status: Home / Self Care | Payer: Self-pay | Source: Ambulatory Visit | Attending: Internal Medicine | Admitting: Internal Medicine

## 2014-06-25 ENCOUNTER — Encounter (HOSPITAL_COMMUNITY)
Admission: RE | Admit: 2014-06-25 | Discharge: 2014-06-25 | Disposition: A | Discharge status: Home / Self Care | Payer: Self-pay | Source: Ambulatory Visit | Attending: Internal Medicine | Admitting: Internal Medicine

## 2014-06-27 ENCOUNTER — Encounter (HOSPITAL_COMMUNITY)
Admission: RE | Admit: 2014-06-27 | Discharge: 2014-06-27 | Disposition: A | Discharge status: Home / Self Care | Payer: Self-pay | Source: Ambulatory Visit | Attending: Internal Medicine | Admitting: Internal Medicine

## 2014-07-02 ENCOUNTER — Encounter (HOSPITAL_COMMUNITY)
Admission: RE | Admit: 2014-07-02 | Discharge: 2014-07-02 | Disposition: A | Discharge status: Home / Self Care | Payer: Self-pay | Source: Ambulatory Visit | Attending: Internal Medicine | Admitting: Internal Medicine

## 2014-07-04 ENCOUNTER — Encounter (HOSPITAL_COMMUNITY)
Admission: RE | Admit: 2014-07-04 | Discharge: 2014-07-04 | Disposition: A | Discharge status: Home / Self Care | Payer: Self-pay | Source: Ambulatory Visit | Attending: Internal Medicine | Admitting: Internal Medicine

## 2014-07-09 ENCOUNTER — Encounter (HOSPITAL_COMMUNITY)
Admission: RE | Admit: 2014-07-09 | Discharge: 2014-07-09 | Disposition: A | Discharge status: Home / Self Care | Payer: Self-pay | Source: Ambulatory Visit | Attending: Internal Medicine | Admitting: Internal Medicine

## 2014-07-11 ENCOUNTER — Encounter (HOSPITAL_COMMUNITY)
Admission: RE | Admit: 2014-07-11 | Discharge: 2014-07-11 | Disposition: A | Discharge status: Home / Self Care | Payer: Self-pay | Source: Ambulatory Visit | Attending: Internal Medicine | Admitting: Internal Medicine

## 2014-07-16 ENCOUNTER — Encounter (HOSPITAL_COMMUNITY)
Admission: RE | Admit: 2014-07-16 | Discharge: 2014-07-16 | Disposition: A | Discharge status: Home / Self Care | Payer: Self-pay | Source: Ambulatory Visit | Attending: Internal Medicine | Admitting: Internal Medicine

## 2014-07-16 DIAGNOSIS — Z5189 Encounter for other specified aftercare: Secondary | ICD-10-CM | POA: Insufficient documentation

## 2014-07-16 DIAGNOSIS — J4489 Other specified chronic obstructive pulmonary disease: Secondary | ICD-10-CM | POA: Insufficient documentation

## 2014-07-16 DIAGNOSIS — J449 Chronic obstructive pulmonary disease, unspecified: Secondary | ICD-10-CM | POA: Insufficient documentation

## 2014-07-18 ENCOUNTER — Encounter (HOSPITAL_COMMUNITY)
Admission: RE | Admit: 2014-07-18 | Discharge: 2014-07-18 | Disposition: A | Discharge status: Home / Self Care | Payer: Self-pay | Source: Ambulatory Visit | Attending: Internal Medicine | Admitting: Internal Medicine

## 2014-07-23 ENCOUNTER — Encounter (HOSPITAL_COMMUNITY): Payer: Self-pay

## 2014-07-25 ENCOUNTER — Encounter (HOSPITAL_COMMUNITY)
Admission: RE | Admit: 2014-07-25 | Discharge: 2014-07-25 | Disposition: A | Discharge status: Home / Self Care | Payer: Self-pay | Source: Ambulatory Visit | Attending: Internal Medicine | Admitting: Internal Medicine

## 2014-07-30 ENCOUNTER — Encounter (HOSPITAL_COMMUNITY)
Admission: RE | Admit: 2014-07-30 | Discharge: 2014-07-30 | Disposition: A | Discharge status: Home / Self Care | Payer: Self-pay | Source: Ambulatory Visit | Attending: Internal Medicine | Admitting: Internal Medicine

## 2014-08-01 ENCOUNTER — Encounter (HOSPITAL_COMMUNITY)
Admission: RE | Admit: 2014-08-01 | Discharge: 2014-08-01 | Disposition: A | Discharge status: Home / Self Care | Payer: Self-pay | Source: Ambulatory Visit | Attending: Internal Medicine | Admitting: Internal Medicine

## 2014-08-06 ENCOUNTER — Encounter (HOSPITAL_COMMUNITY): Payer: Self-pay

## 2014-08-08 ENCOUNTER — Encounter (HOSPITAL_COMMUNITY)
Admission: RE | Admit: 2014-08-08 | Discharge: 2014-08-08 | Disposition: A | Discharge status: Home / Self Care | Payer: Self-pay | Source: Ambulatory Visit | Attending: Internal Medicine | Admitting: Internal Medicine

## 2014-08-13 ENCOUNTER — Encounter (HOSPITAL_COMMUNITY)
Admission: RE | Admit: 2014-08-13 | Discharge: 2014-08-13 | Disposition: A | Discharge status: Home / Self Care | Payer: Self-pay | Source: Ambulatory Visit | Attending: Internal Medicine | Admitting: Internal Medicine

## 2014-08-13 DIAGNOSIS — J449 Chronic obstructive pulmonary disease, unspecified: Secondary | ICD-10-CM | POA: Insufficient documentation

## 2014-08-13 DIAGNOSIS — Z5189 Encounter for other specified aftercare: Secondary | ICD-10-CM | POA: Insufficient documentation

## 2014-08-13 DIAGNOSIS — J4489 Other specified chronic obstructive pulmonary disease: Secondary | ICD-10-CM | POA: Insufficient documentation

## 2014-08-15 ENCOUNTER — Encounter (HOSPITAL_COMMUNITY): Payer: Self-pay

## 2014-08-20 ENCOUNTER — Encounter (HOSPITAL_COMMUNITY): Payer: Self-pay

## 2014-08-22 ENCOUNTER — Encounter (HOSPITAL_COMMUNITY)
Admission: RE | Admit: 2014-08-22 | Discharge: 2014-08-22 | Disposition: A | Discharge status: Home / Self Care | Payer: Self-pay | Source: Ambulatory Visit | Attending: Internal Medicine | Admitting: Internal Medicine

## 2014-08-27 ENCOUNTER — Encounter (HOSPITAL_COMMUNITY): Payer: Self-pay

## 2014-08-29 ENCOUNTER — Encounter (HOSPITAL_COMMUNITY)
Admission: RE | Admit: 2014-08-29 | Discharge: 2014-08-29 | Disposition: A | Discharge status: Home / Self Care | Payer: Self-pay | Source: Ambulatory Visit | Attending: Internal Medicine | Admitting: Internal Medicine

## 2014-09-03 ENCOUNTER — Encounter (HOSPITAL_COMMUNITY): Payer: Self-pay

## 2014-09-05 ENCOUNTER — Encounter (HOSPITAL_COMMUNITY)
Admission: RE | Admit: 2014-09-05 | Discharge: 2014-09-05 | Disposition: A | Discharge status: Home / Self Care | Payer: Self-pay | Source: Ambulatory Visit | Attending: Internal Medicine | Admitting: Internal Medicine

## 2014-09-10 ENCOUNTER — Encounter (HOSPITAL_COMMUNITY): Payer: Self-pay

## 2014-09-12 ENCOUNTER — Encounter (HOSPITAL_COMMUNITY): Payer: Self-pay

## 2014-09-12 DIAGNOSIS — J449 Chronic obstructive pulmonary disease, unspecified: Secondary | ICD-10-CM | POA: Insufficient documentation

## 2014-09-17 ENCOUNTER — Encounter (HOSPITAL_COMMUNITY)
Admission: RE | Admit: 2014-09-17 | Discharge: 2014-09-17 | Disposition: A | Discharge status: Home / Self Care | Payer: Self-pay | Source: Ambulatory Visit | Attending: Internal Medicine | Admitting: Internal Medicine

## 2014-09-19 ENCOUNTER — Encounter (HOSPITAL_COMMUNITY)
Admission: RE | Admit: 2014-09-19 | Discharge: 2014-09-19 | Disposition: A | Discharge status: Home / Self Care | Payer: Self-pay | Source: Ambulatory Visit | Attending: Internal Medicine | Admitting: Internal Medicine

## 2014-09-24 ENCOUNTER — Encounter (HOSPITAL_COMMUNITY)
Admission: RE | Admit: 2014-09-24 | Discharge: 2014-09-24 | Disposition: A | Discharge status: Home / Self Care | Payer: Self-pay | Source: Ambulatory Visit | Attending: Internal Medicine | Admitting: Internal Medicine

## 2014-09-26 ENCOUNTER — Encounter (HOSPITAL_COMMUNITY)
Admission: RE | Admit: 2014-09-26 | Discharge: 2014-09-26 | Disposition: A | Discharge status: Home / Self Care | Payer: Self-pay | Source: Ambulatory Visit | Attending: Internal Medicine | Admitting: Internal Medicine

## 2014-10-01 ENCOUNTER — Encounter (HOSPITAL_COMMUNITY)
Admission: RE | Admit: 2014-10-01 | Discharge: 2014-10-01 | Disposition: A | Discharge status: Home / Self Care | Payer: Self-pay | Source: Ambulatory Visit | Attending: Internal Medicine | Admitting: Internal Medicine

## 2014-10-03 ENCOUNTER — Encounter (HOSPITAL_COMMUNITY)
Admission: RE | Admit: 2014-10-03 | Discharge: 2014-10-03 | Disposition: A | Discharge status: Home / Self Care | Payer: Self-pay | Source: Ambulatory Visit | Attending: Internal Medicine | Admitting: Internal Medicine

## 2014-10-08 ENCOUNTER — Encounter (HOSPITAL_COMMUNITY)
Admission: RE | Admit: 2014-10-08 | Discharge: 2014-10-08 | Disposition: A | Discharge status: Home / Self Care | Payer: Self-pay | Source: Ambulatory Visit | Attending: Internal Medicine | Admitting: Internal Medicine

## 2014-10-10 ENCOUNTER — Encounter (HOSPITAL_COMMUNITY)
Admission: RE | Admit: 2014-10-10 | Discharge: 2014-10-10 | Disposition: A | Discharge status: Home / Self Care | Payer: Self-pay | Source: Ambulatory Visit | Attending: Internal Medicine | Admitting: Internal Medicine

## 2014-10-15 ENCOUNTER — Encounter (HOSPITAL_COMMUNITY)
Admission: RE | Admit: 2014-10-15 | Discharge: 2014-10-15 | Disposition: A | Discharge status: Home / Self Care | Payer: Self-pay | Source: Ambulatory Visit | Attending: Internal Medicine | Admitting: Internal Medicine

## 2014-10-15 DIAGNOSIS — J449 Chronic obstructive pulmonary disease, unspecified: Secondary | ICD-10-CM | POA: Insufficient documentation

## 2014-10-17 ENCOUNTER — Encounter (HOSPITAL_COMMUNITY): Payer: Self-pay

## 2014-10-22 ENCOUNTER — Encounter (HOSPITAL_COMMUNITY): Payer: Self-pay

## 2014-10-24 ENCOUNTER — Encounter (HOSPITAL_COMMUNITY): Payer: Self-pay

## 2014-10-29 ENCOUNTER — Encounter (HOSPITAL_COMMUNITY)
Admission: RE | Admit: 2014-10-29 | Discharge: 2014-10-29 | Disposition: A | Discharge status: Home / Self Care | Payer: Self-pay | Source: Ambulatory Visit | Attending: Internal Medicine | Admitting: Internal Medicine

## 2014-10-31 ENCOUNTER — Encounter (HOSPITAL_COMMUNITY): Payer: Self-pay

## 2014-11-05 ENCOUNTER — Encounter (HOSPITAL_COMMUNITY): Payer: Self-pay

## 2014-11-07 ENCOUNTER — Encounter (HOSPITAL_COMMUNITY): Payer: Self-pay

## 2014-11-12 ENCOUNTER — Encounter (HOSPITAL_COMMUNITY): Payer: Medicare Other

## 2014-11-12 DIAGNOSIS — J449 Chronic obstructive pulmonary disease, unspecified: Secondary | ICD-10-CM | POA: Insufficient documentation

## 2014-11-14 ENCOUNTER — Encounter (HOSPITAL_COMMUNITY): Payer: Self-pay

## 2014-11-19 ENCOUNTER — Encounter (HOSPITAL_COMMUNITY): Admission: RE | Admit: 2014-11-19 | Payer: Self-pay | Source: Ambulatory Visit

## 2014-11-21 ENCOUNTER — Encounter (HOSPITAL_COMMUNITY): Admission: RE | Admit: 2014-11-21 | Payer: Self-pay | Source: Ambulatory Visit

## 2014-11-26 ENCOUNTER — Encounter (HOSPITAL_COMMUNITY)
Admission: RE | Admit: 2014-11-26 | Discharge: 2014-11-26 | Disposition: A | Discharge status: Home / Self Care | Payer: Self-pay | Source: Ambulatory Visit | Attending: Internal Medicine | Admitting: Internal Medicine

## 2014-11-28 ENCOUNTER — Encounter (HOSPITAL_COMMUNITY)
Admission: RE | Admit: 2014-11-28 | Discharge: 2014-11-28 | Disposition: A | Discharge status: Home / Self Care | Payer: Self-pay | Source: Ambulatory Visit | Attending: Internal Medicine | Admitting: Internal Medicine

## 2014-12-03 ENCOUNTER — Encounter (HOSPITAL_COMMUNITY)
Admission: RE | Admit: 2014-12-03 | Discharge: 2014-12-03 | Disposition: A | Discharge status: Home / Self Care | Payer: Self-pay | Source: Ambulatory Visit | Attending: Internal Medicine | Admitting: Internal Medicine

## 2014-12-05 ENCOUNTER — Encounter (HOSPITAL_COMMUNITY): Payer: Self-pay

## 2014-12-10 ENCOUNTER — Encounter (HOSPITAL_COMMUNITY)
Admission: RE | Admit: 2014-12-10 | Discharge: 2014-12-10 | Disposition: A | Discharge status: Home / Self Care | Payer: Self-pay | Source: Ambulatory Visit | Attending: Internal Medicine | Admitting: Internal Medicine

## 2014-12-12 ENCOUNTER — Encounter (HOSPITAL_COMMUNITY)
Admission: RE | Admit: 2014-12-12 | Discharge: 2014-12-12 | Disposition: A | Discharge status: Home / Self Care | Payer: Self-pay | Source: Ambulatory Visit | Attending: Internal Medicine | Admitting: Internal Medicine

## 2014-12-17 ENCOUNTER — Encounter (HOSPITAL_COMMUNITY): Payer: Medicare Other

## 2014-12-17 DIAGNOSIS — J449 Chronic obstructive pulmonary disease, unspecified: Secondary | ICD-10-CM | POA: Insufficient documentation

## 2014-12-19 ENCOUNTER — Encounter (HOSPITAL_COMMUNITY)
Admission: RE | Admit: 2014-12-19 | Discharge: 2014-12-19 | Disposition: A | Discharge status: Home / Self Care | Payer: Self-pay | Source: Ambulatory Visit | Attending: Internal Medicine | Admitting: Internal Medicine

## 2014-12-24 ENCOUNTER — Encounter (HOSPITAL_COMMUNITY)
Admission: RE | Admit: 2014-12-24 | Discharge: 2014-12-24 | Disposition: A | Discharge status: Home / Self Care | Payer: Self-pay | Source: Ambulatory Visit | Attending: Internal Medicine | Admitting: Internal Medicine

## 2014-12-26 ENCOUNTER — Encounter (HOSPITAL_COMMUNITY): Payer: Self-pay

## 2014-12-31 ENCOUNTER — Encounter (HOSPITAL_COMMUNITY): Payer: Self-pay

## 2015-01-02 ENCOUNTER — Encounter (HOSPITAL_COMMUNITY): Payer: Self-pay

## 2015-01-07 ENCOUNTER — Encounter (HOSPITAL_COMMUNITY): Payer: Self-pay

## 2015-01-09 ENCOUNTER — Encounter (HOSPITAL_COMMUNITY)
Admission: RE | Admit: 2015-01-09 | Discharge: 2015-01-09 | Disposition: A | Discharge status: Home / Self Care | Payer: Self-pay | Source: Ambulatory Visit | Attending: Internal Medicine | Admitting: Internal Medicine

## 2015-01-14 ENCOUNTER — Encounter (HOSPITAL_COMMUNITY)
Admission: RE | Admit: 2015-01-14 | Discharge: 2015-01-14 | Disposition: A | Discharge status: Home / Self Care | Payer: Self-pay | Source: Ambulatory Visit | Attending: Internal Medicine | Admitting: Internal Medicine

## 2015-01-14 DIAGNOSIS — J449 Chronic obstructive pulmonary disease, unspecified: Secondary | ICD-10-CM | POA: Insufficient documentation

## 2015-01-16 ENCOUNTER — Encounter (HOSPITAL_COMMUNITY)
Admission: RE | Admit: 2015-01-16 | Discharge: 2015-01-16 | Disposition: A | Discharge status: Home / Self Care | Payer: Self-pay | Source: Ambulatory Visit | Attending: Internal Medicine | Admitting: Internal Medicine

## 2015-01-21 ENCOUNTER — Encounter (HOSPITAL_COMMUNITY)
Admission: RE | Admit: 2015-01-21 | Discharge: 2015-01-21 | Disposition: A | Discharge status: Home / Self Care | Payer: Self-pay | Source: Ambulatory Visit | Attending: Internal Medicine | Admitting: Internal Medicine

## 2015-01-23 ENCOUNTER — Encounter (HOSPITAL_COMMUNITY): Payer: Self-pay

## 2015-01-28 ENCOUNTER — Encounter (HOSPITAL_COMMUNITY): Payer: Self-pay

## 2015-01-30 ENCOUNTER — Encounter (HOSPITAL_COMMUNITY)
Admission: RE | Admit: 2015-01-30 | Discharge: 2015-01-30 | Disposition: A | Discharge status: Home / Self Care | Payer: Self-pay | Source: Ambulatory Visit | Attending: Internal Medicine | Admitting: Internal Medicine

## 2015-01-30 NOTE — Progress Notes (Signed)
Tiant completed a Six-Minute Walk Test on 01/30/15 . Rajinder walked 881 feet with 0 breaks.  The patient's lowest oxygen saturation was 91% , highest heart rate was 102 , and highest blood pressure was 160/68. The patient was on 3 liters of oxygen with a nasal cannula. Anthony Petersen stated that 8/10 leg pain hindered their walk test.

## 2015-02-04 ENCOUNTER — Encounter (HOSPITAL_COMMUNITY): Payer: Self-pay

## 2015-02-06 ENCOUNTER — Encounter (HOSPITAL_COMMUNITY): Payer: Self-pay

## 2015-02-11 ENCOUNTER — Encounter (HOSPITAL_COMMUNITY): Payer: Self-pay

## 2015-02-11 DIAGNOSIS — J449 Chronic obstructive pulmonary disease, unspecified: Secondary | ICD-10-CM | POA: Insufficient documentation

## 2015-02-13 ENCOUNTER — Encounter (HOSPITAL_COMMUNITY)
Admission: RE | Admit: 2015-02-13 | Discharge: 2015-02-13 | Disposition: A | Discharge status: Home / Self Care | Payer: Self-pay | Source: Ambulatory Visit | Attending: Internal Medicine | Admitting: Internal Medicine

## 2015-02-18 ENCOUNTER — Encounter (HOSPITAL_COMMUNITY)
Admission: RE | Admit: 2015-02-18 | Discharge: 2015-02-18 | Disposition: A | Discharge status: Home / Self Care | Payer: Self-pay | Source: Ambulatory Visit | Attending: Internal Medicine | Admitting: Internal Medicine

## 2015-02-20 ENCOUNTER — Encounter (HOSPITAL_COMMUNITY)
Admission: RE | Admit: 2015-02-20 | Discharge: 2015-02-20 | Disposition: A | Discharge status: Home / Self Care | Payer: Self-pay | Source: Ambulatory Visit | Attending: Internal Medicine | Admitting: Internal Medicine

## 2015-02-25 ENCOUNTER — Encounter (HOSPITAL_COMMUNITY)
Admission: RE | Admit: 2015-02-25 | Discharge: 2015-02-25 | Disposition: A | Discharge status: Home / Self Care | Payer: Self-pay | Source: Ambulatory Visit | Attending: Pulmonary Disease | Admitting: Pulmonary Disease

## 2015-02-27 ENCOUNTER — Encounter (HOSPITAL_COMMUNITY)
Admission: RE | Admit: 2015-02-27 | Discharge: 2015-02-27 | Disposition: A | Discharge status: Home / Self Care | Payer: Self-pay | Source: Ambulatory Visit | Attending: Internal Medicine | Admitting: Internal Medicine

## 2015-03-04 ENCOUNTER — Encounter (HOSPITAL_COMMUNITY): Payer: Self-pay

## 2015-03-06 ENCOUNTER — Encounter (HOSPITAL_COMMUNITY)
Admission: RE | Admit: 2015-03-06 | Discharge: 2015-03-06 | Disposition: A | Discharge status: Home / Self Care | Payer: Self-pay | Source: Ambulatory Visit | Attending: Internal Medicine | Admitting: Internal Medicine

## 2015-03-11 ENCOUNTER — Encounter (HOSPITAL_COMMUNITY)
Admission: RE | Admit: 2015-03-11 | Discharge: 2015-03-11 | Disposition: A | Discharge status: Home / Self Care | Payer: Self-pay | Source: Ambulatory Visit | Attending: Internal Medicine | Admitting: Internal Medicine

## 2015-03-13 ENCOUNTER — Encounter (HOSPITAL_COMMUNITY)
Admission: RE | Admit: 2015-03-13 | Discharge: 2015-03-13 | Disposition: A | Discharge status: Home / Self Care | Payer: Self-pay | Source: Ambulatory Visit | Attending: Internal Medicine | Admitting: Internal Medicine

## 2015-03-18 ENCOUNTER — Encounter (HOSPITAL_COMMUNITY)
Admission: RE | Admit: 2015-03-18 | Discharge: 2015-03-18 | Disposition: A | Discharge status: Home / Self Care | Payer: Self-pay | Source: Ambulatory Visit | Attending: Internal Medicine | Admitting: Internal Medicine

## 2015-03-18 DIAGNOSIS — J449 Chronic obstructive pulmonary disease, unspecified: Secondary | ICD-10-CM | POA: Insufficient documentation

## 2015-03-20 ENCOUNTER — Encounter (HOSPITAL_COMMUNITY)
Admission: RE | Admit: 2015-03-20 | Discharge: 2015-03-20 | Disposition: A | Discharge status: Home / Self Care | Payer: Self-pay | Source: Ambulatory Visit | Attending: Internal Medicine | Admitting: Internal Medicine

## 2015-03-25 ENCOUNTER — Encounter (HOSPITAL_COMMUNITY): Payer: Self-pay

## 2015-03-27 ENCOUNTER — Encounter (HOSPITAL_COMMUNITY): Payer: Self-pay

## 2015-04-01 ENCOUNTER — Encounter (HOSPITAL_COMMUNITY)
Admission: RE | Admit: 2015-04-01 | Discharge: 2015-04-01 | Disposition: A | Discharge status: Home / Self Care | Payer: Self-pay | Source: Ambulatory Visit | Attending: Internal Medicine | Admitting: Internal Medicine

## 2015-04-03 ENCOUNTER — Encounter (HOSPITAL_COMMUNITY)
Admission: RE | Admit: 2015-04-03 | Discharge: 2015-04-03 | Disposition: A | Discharge status: Home / Self Care | Payer: Self-pay | Source: Ambulatory Visit | Attending: Internal Medicine | Admitting: Internal Medicine

## 2015-04-08 ENCOUNTER — Encounter (HOSPITAL_COMMUNITY): Payer: Self-pay

## 2015-04-10 ENCOUNTER — Encounter (HOSPITAL_COMMUNITY): Payer: Self-pay

## 2015-04-15 ENCOUNTER — Encounter (HOSPITAL_COMMUNITY)
Admission: RE | Admit: 2015-04-15 | Discharge: 2015-04-15 | Disposition: A | Discharge status: Home / Self Care | Payer: Self-pay | Source: Ambulatory Visit | Attending: Internal Medicine | Admitting: Internal Medicine

## 2015-04-15 DIAGNOSIS — J449 Chronic obstructive pulmonary disease, unspecified: Secondary | ICD-10-CM | POA: Insufficient documentation

## 2015-04-17 ENCOUNTER — Encounter (HOSPITAL_COMMUNITY)
Admission: RE | Admit: 2015-04-17 | Discharge: 2015-04-17 | Disposition: A | Discharge status: Home / Self Care | Payer: Self-pay | Source: Ambulatory Visit | Attending: Internal Medicine | Admitting: Internal Medicine

## 2015-04-22 ENCOUNTER — Encounter (HOSPITAL_COMMUNITY)
Admission: RE | Admit: 2015-04-22 | Discharge: 2015-04-22 | Disposition: A | Discharge status: Home / Self Care | Payer: Self-pay | Source: Ambulatory Visit | Attending: Internal Medicine | Admitting: Internal Medicine

## 2015-04-24 ENCOUNTER — Encounter (HOSPITAL_COMMUNITY)
Admission: RE | Admit: 2015-04-24 | Discharge: 2015-04-24 | Disposition: A | Discharge status: Home / Self Care | Payer: Self-pay | Source: Ambulatory Visit | Attending: Internal Medicine | Admitting: Internal Medicine

## 2015-04-29 ENCOUNTER — Encounter (HOSPITAL_COMMUNITY): Payer: Self-pay

## 2015-05-01 ENCOUNTER — Encounter (HOSPITAL_COMMUNITY)
Admission: RE | Admit: 2015-05-01 | Discharge: 2015-05-01 | Disposition: A | Discharge status: Home / Self Care | Payer: Self-pay | Source: Ambulatory Visit | Attending: Internal Medicine | Admitting: Internal Medicine

## 2015-05-06 ENCOUNTER — Encounter (HOSPITAL_COMMUNITY): Payer: Self-pay

## 2015-05-08 ENCOUNTER — Encounter (HOSPITAL_COMMUNITY)
Admission: RE | Admit: 2015-05-08 | Discharge: 2015-05-08 | Disposition: A | Discharge status: Home / Self Care | Payer: Self-pay | Source: Ambulatory Visit | Attending: Internal Medicine | Admitting: Internal Medicine

## 2015-05-13 ENCOUNTER — Encounter (HOSPITAL_COMMUNITY)
Admission: RE | Admit: 2015-05-13 | Discharge: 2015-05-13 | Disposition: A | Discharge status: Home / Self Care | Payer: Self-pay | Source: Ambulatory Visit | Attending: Internal Medicine | Admitting: Internal Medicine

## 2015-05-15 ENCOUNTER — Encounter (HOSPITAL_COMMUNITY): Payer: Self-pay

## 2015-05-15 DIAGNOSIS — J449 Chronic obstructive pulmonary disease, unspecified: Secondary | ICD-10-CM | POA: Insufficient documentation

## 2015-05-20 ENCOUNTER — Encounter (HOSPITAL_COMMUNITY)
Admission: RE | Admit: 2015-05-20 | Discharge: 2015-05-20 | Disposition: A | Discharge status: Home / Self Care | Payer: Self-pay | Source: Ambulatory Visit | Attending: Internal Medicine | Admitting: Internal Medicine

## 2015-05-22 ENCOUNTER — Encounter (HOSPITAL_COMMUNITY)
Admission: RE | Admit: 2015-05-22 | Discharge: 2015-05-22 | Disposition: A | Discharge status: Home / Self Care | Payer: Self-pay | Source: Ambulatory Visit | Attending: Internal Medicine | Admitting: Internal Medicine

## 2015-05-27 ENCOUNTER — Encounter (HOSPITAL_COMMUNITY): Payer: Self-pay

## 2015-05-29 ENCOUNTER — Encounter (HOSPITAL_COMMUNITY): Payer: Self-pay

## 2015-06-03 ENCOUNTER — Encounter (HOSPITAL_COMMUNITY)
Admission: RE | Admit: 2015-06-03 | Discharge: 2015-06-03 | Disposition: A | Discharge status: Home / Self Care | Payer: Self-pay | Source: Ambulatory Visit | Attending: Internal Medicine | Admitting: Internal Medicine

## 2015-06-05 ENCOUNTER — Encounter (HOSPITAL_COMMUNITY)
Admission: RE | Admit: 2015-06-05 | Discharge: 2015-06-05 | Disposition: A | Discharge status: Home / Self Care | Payer: Self-pay | Source: Ambulatory Visit | Attending: Internal Medicine | Admitting: Internal Medicine

## 2015-06-10 ENCOUNTER — Encounter (HOSPITAL_COMMUNITY): Payer: Self-pay

## 2015-06-12 ENCOUNTER — Encounter (HOSPITAL_COMMUNITY)
Admission: RE | Admit: 2015-06-12 | Discharge: 2015-06-12 | Disposition: A | Discharge status: Home / Self Care | Payer: Self-pay | Source: Ambulatory Visit | Attending: Internal Medicine | Admitting: Internal Medicine

## 2015-06-24 ENCOUNTER — Encounter (HOSPITAL_COMMUNITY)
Admission: RE | Admit: 2015-06-24 | Discharge: 2015-06-24 | Disposition: A | Discharge status: Home / Self Care | Payer: Self-pay | Source: Ambulatory Visit | Attending: Internal Medicine | Admitting: Internal Medicine

## 2015-06-24 DIAGNOSIS — J449 Chronic obstructive pulmonary disease, unspecified: Secondary | ICD-10-CM | POA: Insufficient documentation

## 2015-06-26 ENCOUNTER — Encounter (HOSPITAL_COMMUNITY)
Admission: RE | Admit: 2015-06-26 | Discharge: 2015-06-26 | Disposition: A | Discharge status: Home / Self Care | Payer: Self-pay | Source: Ambulatory Visit | Attending: Internal Medicine | Admitting: Internal Medicine

## 2015-07-01 ENCOUNTER — Encounter (HOSPITAL_COMMUNITY)
Admission: RE | Admit: 2015-07-01 | Discharge: 2015-07-01 | Disposition: A | Discharge status: Home / Self Care | Payer: Self-pay | Source: Ambulatory Visit | Attending: Internal Medicine | Admitting: Internal Medicine

## 2015-07-03 ENCOUNTER — Encounter (HOSPITAL_COMMUNITY)
Admission: RE | Admit: 2015-07-03 | Discharge: 2015-07-03 | Disposition: A | Discharge status: Home / Self Care | Payer: Self-pay | Source: Ambulatory Visit | Attending: Internal Medicine | Admitting: Internal Medicine

## 2015-07-08 ENCOUNTER — Encounter (HOSPITAL_COMMUNITY): Payer: Self-pay

## 2015-07-10 ENCOUNTER — Encounter (HOSPITAL_COMMUNITY)
Admission: RE | Admit: 2015-07-10 | Discharge: 2015-07-10 | Disposition: A | Discharge status: Home / Self Care | Payer: Self-pay | Source: Ambulatory Visit | Attending: Internal Medicine | Admitting: Internal Medicine

## 2015-07-15 ENCOUNTER — Encounter (HOSPITAL_COMMUNITY): Payer: Medicare Other

## 2015-07-15 DIAGNOSIS — J449 Chronic obstructive pulmonary disease, unspecified: Secondary | ICD-10-CM | POA: Insufficient documentation

## 2015-07-17 ENCOUNTER — Encounter (HOSPITAL_COMMUNITY)
Admission: RE | Admit: 2015-07-17 | Discharge: 2015-07-17 | Disposition: A | Discharge status: Home / Self Care | Payer: Self-pay | Source: Ambulatory Visit | Attending: Internal Medicine | Admitting: Internal Medicine

## 2015-07-22 ENCOUNTER — Encounter (HOSPITAL_COMMUNITY)
Admission: RE | Admit: 2015-07-22 | Discharge: 2015-07-22 | Disposition: A | Discharge status: Home / Self Care | Payer: Self-pay | Source: Ambulatory Visit | Attending: Internal Medicine | Admitting: Internal Medicine

## 2015-07-24 ENCOUNTER — Encounter (HOSPITAL_COMMUNITY)
Admission: RE | Admit: 2015-07-24 | Discharge: 2015-07-24 | Disposition: A | Discharge status: Home / Self Care | Payer: Self-pay | Source: Ambulatory Visit | Attending: Internal Medicine | Admitting: Internal Medicine

## 2015-07-29 ENCOUNTER — Encounter (HOSPITAL_COMMUNITY)
Admission: RE | Admit: 2015-07-29 | Discharge: 2015-07-29 | Disposition: A | Discharge status: Home / Self Care | Payer: Self-pay | Source: Ambulatory Visit | Attending: Internal Medicine | Admitting: Internal Medicine

## 2015-07-31 ENCOUNTER — Encounter (HOSPITAL_COMMUNITY)
Admission: RE | Admit: 2015-07-31 | Discharge: 2015-07-31 | Disposition: A | Discharge status: Home / Self Care | Payer: Self-pay | Source: Ambulatory Visit | Attending: Internal Medicine | Admitting: Internal Medicine

## 2015-08-05 ENCOUNTER — Encounter (HOSPITAL_COMMUNITY)
Admission: RE | Admit: 2015-08-05 | Discharge: 2015-08-05 | Disposition: A | Discharge status: Home / Self Care | Payer: Self-pay | Source: Ambulatory Visit | Attending: Internal Medicine | Admitting: Internal Medicine

## 2015-08-07 ENCOUNTER — Encounter (HOSPITAL_COMMUNITY)
Admission: RE | Admit: 2015-08-07 | Discharge: 2015-08-07 | Disposition: A | Discharge status: Home / Self Care | Payer: Self-pay | Source: Ambulatory Visit | Attending: Internal Medicine | Admitting: Internal Medicine

## 2015-08-12 ENCOUNTER — Encounter (HOSPITAL_COMMUNITY)
Admission: RE | Admit: 2015-08-12 | Discharge: 2015-08-12 | Disposition: A | Discharge status: Home / Self Care | Payer: Self-pay | Source: Ambulatory Visit | Attending: Internal Medicine | Admitting: Internal Medicine

## 2015-08-14 ENCOUNTER — Encounter (HOSPITAL_COMMUNITY): Payer: Self-pay

## 2015-08-14 DIAGNOSIS — J449 Chronic obstructive pulmonary disease, unspecified: Secondary | ICD-10-CM | POA: Insufficient documentation

## 2015-08-19 ENCOUNTER — Encounter (HOSPITAL_COMMUNITY)
Admission: RE | Admit: 2015-08-19 | Discharge: 2015-08-19 | Disposition: A | Discharge status: Home / Self Care | Payer: Self-pay | Source: Ambulatory Visit | Attending: Internal Medicine | Admitting: Internal Medicine

## 2015-08-21 ENCOUNTER — Encounter (HOSPITAL_COMMUNITY)
Admission: RE | Admit: 2015-08-21 | Discharge: 2015-08-21 | Disposition: A | Discharge status: Home / Self Care | Payer: Self-pay | Source: Ambulatory Visit | Attending: Internal Medicine | Admitting: Internal Medicine

## 2015-08-26 ENCOUNTER — Encounter (HOSPITAL_COMMUNITY)
Admission: RE | Admit: 2015-08-26 | Discharge: 2015-08-26 | Disposition: A | Discharge status: Home / Self Care | Payer: Self-pay | Source: Ambulatory Visit | Attending: Internal Medicine | Admitting: Internal Medicine

## 2015-08-28 ENCOUNTER — Encounter (HOSPITAL_COMMUNITY)
Admission: RE | Admit: 2015-08-28 | Discharge: 2015-08-28 | Disposition: A | Discharge status: Home / Self Care | Payer: Self-pay | Source: Ambulatory Visit | Attending: Internal Medicine | Admitting: Internal Medicine

## 2015-09-02 ENCOUNTER — Encounter (HOSPITAL_COMMUNITY)
Admission: RE | Admit: 2015-09-02 | Discharge: 2015-09-02 | Disposition: A | Discharge status: Home / Self Care | Payer: Self-pay | Source: Ambulatory Visit | Attending: Internal Medicine | Admitting: Internal Medicine

## 2015-09-04 ENCOUNTER — Encounter (HOSPITAL_COMMUNITY): Payer: Self-pay

## 2015-09-09 ENCOUNTER — Encounter (HOSPITAL_COMMUNITY)
Admission: RE | Admit: 2015-09-09 | Discharge: 2015-09-09 | Disposition: A | Discharge status: Home / Self Care | Payer: Self-pay | Source: Ambulatory Visit | Attending: Internal Medicine | Admitting: Internal Medicine

## 2015-09-11 ENCOUNTER — Encounter (HOSPITAL_COMMUNITY)
Admission: RE | Admit: 2015-09-11 | Discharge: 2015-09-11 | Disposition: A | Discharge status: Home / Self Care | Payer: Self-pay | Source: Ambulatory Visit | Attending: Internal Medicine | Admitting: Internal Medicine

## 2015-09-16 ENCOUNTER — Encounter (HOSPITAL_COMMUNITY): Payer: Medicare Other

## 2015-09-16 DIAGNOSIS — J449 Chronic obstructive pulmonary disease, unspecified: Secondary | ICD-10-CM | POA: Insufficient documentation

## 2015-09-18 ENCOUNTER — Encounter (HOSPITAL_COMMUNITY): Payer: Self-pay

## 2015-09-23 ENCOUNTER — Encounter (HOSPITAL_COMMUNITY)
Admission: RE | Admit: 2015-09-23 | Discharge: 2015-09-23 | Disposition: A | Discharge status: Home / Self Care | Payer: Self-pay | Source: Ambulatory Visit | Attending: Internal Medicine | Admitting: Internal Medicine

## 2015-09-25 ENCOUNTER — Encounter (HOSPITAL_COMMUNITY)
Admission: RE | Admit: 2015-09-25 | Discharge: 2015-09-25 | Disposition: A | Discharge status: Home / Self Care | Payer: Self-pay | Source: Ambulatory Visit | Attending: Internal Medicine | Admitting: Internal Medicine

## 2015-09-30 ENCOUNTER — Encounter (HOSPITAL_COMMUNITY)
Admission: RE | Admit: 2015-09-30 | Discharge: 2015-09-30 | Disposition: A | Discharge status: Home / Self Care | Payer: Self-pay | Source: Ambulatory Visit | Attending: Internal Medicine | Admitting: Internal Medicine

## 2015-10-02 ENCOUNTER — Encounter (HOSPITAL_COMMUNITY): Payer: Self-pay

## 2015-10-07 ENCOUNTER — Encounter (HOSPITAL_COMMUNITY): Payer: Self-pay

## 2015-10-09 ENCOUNTER — Encounter (HOSPITAL_COMMUNITY)
Admission: RE | Admit: 2015-10-09 | Discharge: 2015-10-09 | Disposition: A | Discharge status: Home / Self Care | Payer: Self-pay | Source: Ambulatory Visit | Attending: Internal Medicine | Admitting: Internal Medicine

## 2015-10-14 ENCOUNTER — Encounter (HOSPITAL_COMMUNITY): Payer: Medicare Other

## 2015-10-14 DIAGNOSIS — J449 Chronic obstructive pulmonary disease, unspecified: Secondary | ICD-10-CM | POA: Insufficient documentation

## 2015-10-16 ENCOUNTER — Encounter (HOSPITAL_COMMUNITY): Payer: Self-pay

## 2015-10-21 ENCOUNTER — Encounter (HOSPITAL_COMMUNITY): Payer: Self-pay

## 2015-10-23 ENCOUNTER — Encounter (HOSPITAL_COMMUNITY): Payer: Self-pay

## 2015-10-28 ENCOUNTER — Encounter (HOSPITAL_COMMUNITY)
Admission: RE | Admit: 2015-10-28 | Discharge: 2015-10-28 | Disposition: A | Discharge status: Home / Self Care | Payer: Self-pay | Source: Ambulatory Visit | Attending: Internal Medicine | Admitting: Internal Medicine

## 2015-10-30 ENCOUNTER — Encounter (HOSPITAL_COMMUNITY)
Admission: RE | Admit: 2015-10-30 | Discharge: 2015-10-30 | Disposition: A | Discharge status: Home / Self Care | Payer: Self-pay | Source: Ambulatory Visit | Attending: Internal Medicine | Admitting: Internal Medicine

## 2015-11-04 ENCOUNTER — Encounter (HOSPITAL_COMMUNITY)
Admission: RE | Admit: 2015-11-04 | Discharge: 2015-11-04 | Disposition: A | Discharge status: Home / Self Care | Payer: Self-pay | Source: Ambulatory Visit | Attending: Internal Medicine | Admitting: Internal Medicine

## 2015-11-06 ENCOUNTER — Encounter (HOSPITAL_COMMUNITY): Payer: Self-pay

## 2015-11-11 ENCOUNTER — Encounter (HOSPITAL_COMMUNITY)
Admission: RE | Admit: 2015-11-11 | Discharge: 2015-11-11 | Disposition: A | Discharge status: Home / Self Care | Payer: Self-pay | Source: Ambulatory Visit | Attending: Internal Medicine | Admitting: Internal Medicine

## 2015-11-13 ENCOUNTER — Encounter (HOSPITAL_COMMUNITY): Payer: Self-pay

## 2015-11-13 DIAGNOSIS — J449 Chronic obstructive pulmonary disease, unspecified: Secondary | ICD-10-CM | POA: Insufficient documentation

## 2015-11-18 ENCOUNTER — Encounter (HOSPITAL_COMMUNITY)
Admission: RE | Admit: 2015-11-18 | Discharge: 2015-11-18 | Disposition: A | Discharge status: Home / Self Care | Payer: Self-pay | Source: Ambulatory Visit | Attending: Internal Medicine | Admitting: Internal Medicine

## 2015-11-20 ENCOUNTER — Encounter (HOSPITAL_COMMUNITY)
Admission: RE | Admit: 2015-11-20 | Discharge: 2015-11-20 | Disposition: A | Discharge status: Home / Self Care | Payer: Self-pay | Source: Ambulatory Visit | Attending: Internal Medicine | Admitting: Internal Medicine

## 2015-11-25 ENCOUNTER — Encounter (HOSPITAL_COMMUNITY): Payer: Self-pay

## 2015-11-27 ENCOUNTER — Encounter (HOSPITAL_COMMUNITY): Payer: Self-pay

## 2015-12-02 ENCOUNTER — Encounter (HOSPITAL_COMMUNITY): Admission: RE | Admit: 2015-12-02 | Payer: Self-pay | Source: Ambulatory Visit

## 2015-12-04 ENCOUNTER — Encounter (HOSPITAL_COMMUNITY): Payer: Self-pay

## 2015-12-09 ENCOUNTER — Encounter (HOSPITAL_COMMUNITY)
Admission: RE | Admit: 2015-12-09 | Discharge: 2015-12-09 | Disposition: A | Discharge status: Home / Self Care | Payer: Self-pay | Source: Ambulatory Visit | Attending: Internal Medicine | Admitting: Internal Medicine

## 2015-12-11 ENCOUNTER — Encounter (HOSPITAL_COMMUNITY): Payer: Self-pay

## 2015-12-16 ENCOUNTER — Encounter (HOSPITAL_COMMUNITY): Payer: Medicare Other

## 2015-12-16 DIAGNOSIS — J449 Chronic obstructive pulmonary disease, unspecified: Secondary | ICD-10-CM | POA: Insufficient documentation

## 2015-12-18 ENCOUNTER — Encounter (HOSPITAL_COMMUNITY)
Admission: RE | Admit: 2015-12-18 | Discharge: 2015-12-18 | Disposition: A | Discharge status: Home / Self Care | Payer: Self-pay | Source: Ambulatory Visit | Attending: Internal Medicine | Admitting: Internal Medicine

## 2015-12-23 ENCOUNTER — Encounter (HOSPITAL_COMMUNITY): Payer: Self-pay

## 2015-12-25 ENCOUNTER — Encounter (HOSPITAL_COMMUNITY): Payer: Self-pay

## 2015-12-30 ENCOUNTER — Encounter (HOSPITAL_COMMUNITY)
Admission: RE | Admit: 2015-12-30 | Discharge: 2015-12-30 | Disposition: A | Discharge status: Home / Self Care | Payer: Self-pay | Source: Ambulatory Visit | Attending: Internal Medicine | Admitting: Internal Medicine

## 2016-01-01 ENCOUNTER — Encounter (HOSPITAL_COMMUNITY): Payer: Self-pay

## 2016-01-06 ENCOUNTER — Encounter (HOSPITAL_COMMUNITY): Payer: Self-pay

## 2016-01-08 ENCOUNTER — Encounter (HOSPITAL_COMMUNITY)
Admission: RE | Admit: 2016-01-08 | Discharge: 2016-01-08 | Disposition: A | Discharge status: Home / Self Care | Payer: Self-pay | Source: Ambulatory Visit | Attending: Internal Medicine | Admitting: Internal Medicine

## 2016-01-13 ENCOUNTER — Encounter (HOSPITAL_COMMUNITY): Payer: Self-pay

## 2016-01-15 ENCOUNTER — Encounter (HOSPITAL_COMMUNITY)
Admission: RE | Admit: 2016-01-15 | Discharge: 2016-01-15 | Disposition: A | Discharge status: Home / Self Care | Payer: Self-pay | Source: Ambulatory Visit | Attending: Internal Medicine | Admitting: Internal Medicine

## 2016-01-15 DIAGNOSIS — J449 Chronic obstructive pulmonary disease, unspecified: Secondary | ICD-10-CM | POA: Insufficient documentation

## 2016-01-20 ENCOUNTER — Encounter (HOSPITAL_COMMUNITY): Payer: Self-pay

## 2016-01-27 ENCOUNTER — Encounter (HOSPITAL_COMMUNITY)
Admission: RE | Admit: 2016-01-27 | Discharge: 2016-01-27 | Disposition: A | Discharge status: Home / Self Care | Payer: Self-pay | Source: Ambulatory Visit | Attending: Internal Medicine | Admitting: Internal Medicine

## 2016-01-29 ENCOUNTER — Encounter (HOSPITAL_COMMUNITY)
Admission: RE | Admit: 2016-01-29 | Discharge: 2016-01-29 | Disposition: A | Discharge status: Home / Self Care | Payer: Self-pay | Source: Ambulatory Visit | Attending: Internal Medicine | Admitting: Internal Medicine

## 2016-02-03 ENCOUNTER — Encounter (HOSPITAL_COMMUNITY)
Admission: RE | Admit: 2016-02-03 | Discharge: 2016-02-03 | Disposition: A | Discharge status: Home / Self Care | Payer: Self-pay | Source: Ambulatory Visit | Attending: Internal Medicine | Admitting: Internal Medicine

## 2016-02-05 ENCOUNTER — Encounter (HOSPITAL_COMMUNITY)
Admission: RE | Admit: 2016-02-05 | Discharge: 2016-02-05 | Disposition: A | Discharge status: Home / Self Care | Payer: Self-pay | Source: Ambulatory Visit | Attending: Internal Medicine | Admitting: Internal Medicine

## 2016-02-10 ENCOUNTER — Encounter (HOSPITAL_COMMUNITY)
Admission: RE | Admit: 2016-02-10 | Discharge: 2016-02-10 | Disposition: A | Discharge status: Home / Self Care | Payer: Self-pay | Source: Ambulatory Visit | Attending: Internal Medicine | Admitting: Internal Medicine

## 2016-02-12 ENCOUNTER — Encounter (HOSPITAL_COMMUNITY)
Admission: RE | Admit: 2016-02-12 | Discharge: 2016-02-12 | Disposition: A | Discharge status: Home / Self Care | Payer: Self-pay | Source: Ambulatory Visit | Attending: Internal Medicine | Admitting: Internal Medicine

## 2016-02-12 DIAGNOSIS — J449 Chronic obstructive pulmonary disease, unspecified: Secondary | ICD-10-CM | POA: Insufficient documentation

## 2016-02-17 ENCOUNTER — Encounter (HOSPITAL_COMMUNITY): Payer: Self-pay

## 2016-02-19 ENCOUNTER — Encounter (HOSPITAL_COMMUNITY)
Admission: RE | Admit: 2016-02-19 | Discharge: 2016-02-19 | Disposition: A | Discharge status: Home / Self Care | Payer: Self-pay | Source: Ambulatory Visit | Attending: Internal Medicine | Admitting: Internal Medicine

## 2016-02-24 ENCOUNTER — Encounter (HOSPITAL_COMMUNITY): Payer: Self-pay

## 2016-02-26 ENCOUNTER — Encounter (HOSPITAL_COMMUNITY): Payer: Self-pay

## 2016-03-02 ENCOUNTER — Encounter (HOSPITAL_COMMUNITY)
Admission: RE | Admit: 2016-03-02 | Discharge: 2016-03-02 | Disposition: A | Discharge status: Home / Self Care | Payer: Self-pay | Source: Ambulatory Visit | Attending: Internal Medicine | Admitting: Internal Medicine

## 2016-03-04 ENCOUNTER — Encounter (HOSPITAL_COMMUNITY)
Admission: RE | Admit: 2016-03-04 | Discharge: 2016-03-04 | Disposition: A | Discharge status: Home / Self Care | Payer: Self-pay | Source: Ambulatory Visit | Attending: Internal Medicine | Admitting: Internal Medicine

## 2016-03-09 ENCOUNTER — Encounter (HOSPITAL_COMMUNITY)
Admission: RE | Admit: 2016-03-09 | Discharge: 2016-03-09 | Disposition: A | Discharge status: Home / Self Care | Payer: Self-pay | Source: Ambulatory Visit | Attending: Internal Medicine | Admitting: Internal Medicine

## 2016-03-11 ENCOUNTER — Encounter (HOSPITAL_COMMUNITY): Payer: Self-pay

## 2016-03-16 ENCOUNTER — Encounter (HOSPITAL_COMMUNITY): Payer: Self-pay

## 2016-03-16 DIAGNOSIS — J449 Chronic obstructive pulmonary disease, unspecified: Secondary | ICD-10-CM | POA: Insufficient documentation

## 2016-03-18 ENCOUNTER — Encounter (HOSPITAL_COMMUNITY)
Admission: RE | Admit: 2016-03-18 | Discharge: 2016-03-18 | Disposition: A | Discharge status: Home / Self Care | Payer: Self-pay | Source: Ambulatory Visit | Attending: Internal Medicine | Admitting: Internal Medicine

## 2016-03-23 ENCOUNTER — Encounter (HOSPITAL_COMMUNITY)
Admission: RE | Admit: 2016-03-23 | Discharge: 2016-03-23 | Disposition: A | Discharge status: Home / Self Care | Payer: Self-pay | Source: Ambulatory Visit | Attending: Internal Medicine | Admitting: Internal Medicine

## 2016-03-25 ENCOUNTER — Encounter (HOSPITAL_COMMUNITY)
Admission: RE | Admit: 2016-03-25 | Discharge: 2016-03-25 | Disposition: A | Discharge status: Home / Self Care | Payer: Self-pay | Source: Ambulatory Visit | Attending: Internal Medicine | Admitting: Internal Medicine

## 2016-03-30 ENCOUNTER — Encounter (HOSPITAL_COMMUNITY): Payer: Self-pay

## 2016-04-01 ENCOUNTER — Encounter (HOSPITAL_COMMUNITY)
Admission: RE | Admit: 2016-04-01 | Discharge: 2016-04-01 | Disposition: A | Discharge status: Home / Self Care | Payer: Self-pay | Source: Ambulatory Visit | Attending: Internal Medicine | Admitting: Internal Medicine

## 2016-04-06 ENCOUNTER — Encounter (HOSPITAL_COMMUNITY): Payer: Self-pay

## 2016-04-08 ENCOUNTER — Encounter (HOSPITAL_COMMUNITY)
Admission: RE | Admit: 2016-04-08 | Discharge: 2016-04-08 | Disposition: A | Discharge status: Home / Self Care | Payer: Self-pay | Source: Ambulatory Visit | Attending: Internal Medicine | Admitting: Internal Medicine

## 2016-04-13 ENCOUNTER — Encounter (HOSPITAL_COMMUNITY)
Admission: RE | Admit: 2016-04-13 | Discharge: 2016-04-13 | Disposition: A | Discharge status: Home / Self Care | Payer: Self-pay | Source: Ambulatory Visit | Attending: Internal Medicine | Admitting: Internal Medicine

## 2016-04-13 DIAGNOSIS — J449 Chronic obstructive pulmonary disease, unspecified: Secondary | ICD-10-CM | POA: Insufficient documentation

## 2016-04-15 ENCOUNTER — Encounter (HOSPITAL_COMMUNITY)
Admission: RE | Admit: 2016-04-15 | Discharge: 2016-04-15 | Disposition: A | Discharge status: Home / Self Care | Payer: Self-pay | Source: Ambulatory Visit | Attending: Internal Medicine | Admitting: Internal Medicine

## 2016-04-20 ENCOUNTER — Encounter (HOSPITAL_COMMUNITY): Payer: Self-pay

## 2016-04-22 ENCOUNTER — Encounter (HOSPITAL_COMMUNITY)
Admission: RE | Admit: 2016-04-22 | Discharge: 2016-04-22 | Disposition: A | Discharge status: Home / Self Care | Payer: Self-pay | Source: Ambulatory Visit | Attending: Internal Medicine | Admitting: Internal Medicine

## 2016-04-27 ENCOUNTER — Encounter (HOSPITAL_COMMUNITY)
Admission: RE | Admit: 2016-04-27 | Discharge: 2016-04-27 | Disposition: A | Discharge status: Home / Self Care | Payer: Self-pay | Source: Ambulatory Visit | Attending: Internal Medicine | Admitting: Internal Medicine

## 2016-04-29 ENCOUNTER — Encounter (HOSPITAL_COMMUNITY): Payer: Self-pay

## 2016-05-04 ENCOUNTER — Encounter (HOSPITAL_COMMUNITY)
Admission: RE | Admit: 2016-05-04 | Discharge: 2016-05-04 | Disposition: A | Discharge status: Home / Self Care | Payer: Self-pay | Source: Ambulatory Visit | Attending: Internal Medicine | Admitting: Internal Medicine

## 2016-05-06 ENCOUNTER — Encounter (HOSPITAL_COMMUNITY)
Admission: RE | Admit: 2016-05-06 | Discharge: 2016-05-06 | Disposition: A | Discharge status: Home / Self Care | Payer: Self-pay | Source: Ambulatory Visit | Attending: Internal Medicine | Admitting: Internal Medicine

## 2016-05-11 ENCOUNTER — Encounter (HOSPITAL_COMMUNITY)
Admission: RE | Admit: 2016-05-11 | Discharge: 2016-05-11 | Disposition: A | Discharge status: Home / Self Care | Payer: Self-pay | Source: Ambulatory Visit | Attending: Internal Medicine | Admitting: Internal Medicine

## 2016-05-13 ENCOUNTER — Encounter (HOSPITAL_COMMUNITY)
Admission: RE | Admit: 2016-05-13 | Discharge: 2016-05-13 | Disposition: A | Discharge status: Home / Self Care | Payer: Self-pay | Source: Ambulatory Visit | Attending: Internal Medicine | Admitting: Internal Medicine

## 2016-05-13 DIAGNOSIS — J449 Chronic obstructive pulmonary disease, unspecified: Secondary | ICD-10-CM | POA: Insufficient documentation

## 2016-05-18 ENCOUNTER — Encounter (HOSPITAL_COMMUNITY)
Admission: RE | Admit: 2016-05-18 | Discharge: 2016-05-18 | Disposition: A | Discharge status: Home / Self Care | Payer: Self-pay | Source: Ambulatory Visit | Attending: Internal Medicine | Admitting: Internal Medicine

## 2016-05-20 ENCOUNTER — Encounter (HOSPITAL_COMMUNITY)
Admission: RE | Admit: 2016-05-20 | Discharge: 2016-05-20 | Disposition: A | Discharge status: Home / Self Care | Payer: Self-pay | Source: Ambulatory Visit | Attending: Internal Medicine | Admitting: Internal Medicine

## 2016-05-25 ENCOUNTER — Encounter (HOSPITAL_COMMUNITY)
Admission: RE | Admit: 2016-05-25 | Discharge: 2016-05-25 | Disposition: A | Discharge status: Home / Self Care | Payer: Self-pay | Source: Ambulatory Visit | Attending: Internal Medicine | Admitting: Internal Medicine

## 2016-05-27 ENCOUNTER — Encounter (HOSPITAL_COMMUNITY): Payer: Self-pay

## 2016-06-01 ENCOUNTER — Encounter (HOSPITAL_COMMUNITY)
Admission: RE | Admit: 2016-06-01 | Discharge: 2016-06-01 | Disposition: A | Discharge status: Home / Self Care | Payer: Self-pay | Source: Ambulatory Visit | Attending: Internal Medicine | Admitting: Internal Medicine

## 2016-06-03 ENCOUNTER — Encounter (HOSPITAL_COMMUNITY): Payer: Self-pay

## 2016-06-08 ENCOUNTER — Encounter (HOSPITAL_COMMUNITY)
Admission: RE | Admit: 2016-06-08 | Discharge: 2016-06-08 | Disposition: A | Discharge status: Home / Self Care | Payer: Self-pay | Source: Ambulatory Visit | Attending: Internal Medicine | Admitting: Internal Medicine

## 2016-06-10 ENCOUNTER — Encounter (HOSPITAL_COMMUNITY)
Admission: RE | Admit: 2016-06-10 | Discharge: 2016-06-10 | Disposition: A | Discharge status: Home / Self Care | Payer: Self-pay | Source: Ambulatory Visit | Attending: Internal Medicine | Admitting: Internal Medicine

## 2016-06-15 ENCOUNTER — Encounter (HOSPITAL_COMMUNITY): Payer: Self-pay

## 2016-06-15 DIAGNOSIS — J449 Chronic obstructive pulmonary disease, unspecified: Secondary | ICD-10-CM | POA: Insufficient documentation

## 2016-06-17 ENCOUNTER — Encounter (HOSPITAL_COMMUNITY): Payer: Self-pay

## 2016-06-22 ENCOUNTER — Encounter (HOSPITAL_COMMUNITY)
Admission: RE | Admit: 2016-06-22 | Discharge: 2016-06-22 | Disposition: A | Discharge status: Home / Self Care | Payer: Self-pay | Source: Ambulatory Visit | Attending: Internal Medicine | Admitting: Internal Medicine

## 2016-06-24 ENCOUNTER — Encounter (HOSPITAL_COMMUNITY): Payer: Self-pay

## 2016-06-29 ENCOUNTER — Encounter (HOSPITAL_COMMUNITY): Payer: Self-pay

## 2016-07-01 ENCOUNTER — Encounter (HOSPITAL_COMMUNITY): Payer: Self-pay

## 2016-07-06 ENCOUNTER — Encounter (HOSPITAL_COMMUNITY): Payer: Self-pay

## 2016-07-08 ENCOUNTER — Encounter (HOSPITAL_COMMUNITY)
Admission: RE | Admit: 2016-07-08 | Discharge: 2016-07-08 | Disposition: A | Discharge status: Home / Self Care | Payer: Self-pay | Source: Ambulatory Visit | Attending: Internal Medicine | Admitting: Internal Medicine

## 2016-07-13 ENCOUNTER — Encounter (HOSPITAL_COMMUNITY)
Admission: RE | Admit: 2016-07-13 | Discharge: 2016-07-13 | Disposition: A | Discharge status: Home / Self Care | Payer: Self-pay | Source: Ambulatory Visit | Attending: Internal Medicine | Admitting: Internal Medicine

## 2016-07-13 DIAGNOSIS — J449 Chronic obstructive pulmonary disease, unspecified: Secondary | ICD-10-CM | POA: Insufficient documentation

## 2016-07-15 ENCOUNTER — Encounter (HOSPITAL_COMMUNITY)
Admission: RE | Admit: 2016-07-15 | Discharge: 2016-07-15 | Disposition: A | Discharge status: Home / Self Care | Payer: Self-pay | Source: Ambulatory Visit | Attending: Internal Medicine | Admitting: Internal Medicine

## 2016-07-20 ENCOUNTER — Encounter (HOSPITAL_COMMUNITY)
Admission: RE | Admit: 2016-07-20 | Discharge: 2016-07-20 | Disposition: A | Discharge status: Home / Self Care | Payer: Self-pay | Source: Ambulatory Visit | Attending: Internal Medicine | Admitting: Internal Medicine

## 2016-07-22 ENCOUNTER — Encounter (HOSPITAL_COMMUNITY)
Admission: RE | Admit: 2016-07-22 | Discharge: 2016-07-22 | Disposition: A | Discharge status: Home / Self Care | Payer: Self-pay | Source: Ambulatory Visit | Attending: Internal Medicine | Admitting: Internal Medicine

## 2016-07-27 ENCOUNTER — Encounter (HOSPITAL_COMMUNITY): Payer: Self-pay

## 2016-07-29 ENCOUNTER — Encounter (HOSPITAL_COMMUNITY)
Admission: RE | Admit: 2016-07-29 | Discharge: 2016-07-29 | Disposition: A | Discharge status: Home / Self Care | Payer: Self-pay | Source: Ambulatory Visit | Attending: Internal Medicine | Admitting: Internal Medicine

## 2016-08-03 ENCOUNTER — Encounter (HOSPITAL_COMMUNITY)
Admission: RE | Admit: 2016-08-03 | Discharge: 2016-08-03 | Disposition: A | Discharge status: Home / Self Care | Payer: Self-pay | Source: Ambulatory Visit | Attending: Internal Medicine | Admitting: Internal Medicine

## 2016-08-05 ENCOUNTER — Encounter (HOSPITAL_COMMUNITY)
Admission: RE | Admit: 2016-08-05 | Discharge: 2016-08-05 | Disposition: A | Discharge status: Home / Self Care | Payer: Self-pay | Source: Ambulatory Visit | Attending: Internal Medicine | Admitting: Internal Medicine

## 2016-08-10 ENCOUNTER — Encounter (HOSPITAL_COMMUNITY)
Admission: RE | Admit: 2016-08-10 | Discharge: 2016-08-10 | Disposition: A | Discharge status: Home / Self Care | Payer: Self-pay | Source: Ambulatory Visit | Attending: Internal Medicine | Admitting: Internal Medicine

## 2016-08-12 ENCOUNTER — Encounter (HOSPITAL_COMMUNITY)
Admission: RE | Admit: 2016-08-12 | Discharge: 2016-08-12 | Disposition: A | Discharge status: Home / Self Care | Payer: Self-pay | Source: Ambulatory Visit | Attending: Internal Medicine | Admitting: Internal Medicine

## 2016-08-17 ENCOUNTER — Encounter (HOSPITAL_COMMUNITY)
Admission: RE | Admit: 2016-08-17 | Discharge: 2016-08-17 | Disposition: A | Discharge status: Home / Self Care | Payer: Self-pay | Source: Ambulatory Visit | Attending: Internal Medicine | Admitting: Internal Medicine

## 2016-08-17 DIAGNOSIS — J449 Chronic obstructive pulmonary disease, unspecified: Secondary | ICD-10-CM | POA: Insufficient documentation

## 2016-08-19 ENCOUNTER — Encounter (HOSPITAL_COMMUNITY)
Admission: RE | Admit: 2016-08-19 | Discharge: 2016-08-19 | Disposition: A | Discharge status: Home / Self Care | Payer: Self-pay | Source: Ambulatory Visit | Attending: Internal Medicine | Admitting: Internal Medicine

## 2016-08-24 ENCOUNTER — Encounter (HOSPITAL_COMMUNITY)
Admission: RE | Admit: 2016-08-24 | Discharge: 2016-08-24 | Disposition: A | Discharge status: Home / Self Care | Payer: Self-pay | Source: Ambulatory Visit | Attending: Internal Medicine | Admitting: Internal Medicine

## 2016-08-26 ENCOUNTER — Encounter (HOSPITAL_COMMUNITY)
Admission: RE | Admit: 2016-08-26 | Discharge: 2016-08-26 | Disposition: A | Discharge status: Home / Self Care | Payer: Self-pay | Source: Ambulatory Visit | Attending: Internal Medicine | Admitting: Internal Medicine

## 2016-08-31 ENCOUNTER — Encounter (HOSPITAL_COMMUNITY): Payer: Self-pay

## 2016-09-02 ENCOUNTER — Encounter (HOSPITAL_COMMUNITY)
Admission: RE | Admit: 2016-09-02 | Discharge: 2016-09-02 | Disposition: A | Discharge status: Home / Self Care | Payer: Self-pay | Source: Ambulatory Visit | Attending: Internal Medicine | Admitting: Internal Medicine

## 2016-09-07 ENCOUNTER — Encounter (HOSPITAL_COMMUNITY): Payer: Self-pay

## 2016-09-09 ENCOUNTER — Encounter (HOSPITAL_COMMUNITY)
Admission: RE | Admit: 2016-09-09 | Discharge: 2016-09-09 | Disposition: A | Discharge status: Home / Self Care | Payer: Self-pay | Source: Ambulatory Visit | Attending: Internal Medicine | Admitting: Internal Medicine

## 2016-09-14 ENCOUNTER — Encounter (HOSPITAL_COMMUNITY): Payer: Self-pay

## 2016-09-14 DIAGNOSIS — J449 Chronic obstructive pulmonary disease, unspecified: Secondary | ICD-10-CM | POA: Insufficient documentation

## 2016-09-16 ENCOUNTER — Encounter (HOSPITAL_COMMUNITY): Payer: Self-pay

## 2016-09-21 ENCOUNTER — Encounter (HOSPITAL_COMMUNITY)
Admission: RE | Admit: 2016-09-21 | Discharge: 2016-09-21 | Disposition: A | Discharge status: Home / Self Care | Payer: Self-pay | Source: Ambulatory Visit | Attending: Internal Medicine | Admitting: Internal Medicine

## 2016-09-23 ENCOUNTER — Encounter (HOSPITAL_COMMUNITY)
Admission: RE | Admit: 2016-09-23 | Discharge: 2016-09-23 | Disposition: A | Discharge status: Home / Self Care | Payer: Self-pay | Source: Ambulatory Visit | Attending: Internal Medicine | Admitting: Internal Medicine

## 2016-09-28 ENCOUNTER — Encounter (HOSPITAL_COMMUNITY)
Admission: RE | Admit: 2016-09-28 | Discharge: 2016-09-28 | Disposition: A | Discharge status: Home / Self Care | Payer: Self-pay | Source: Ambulatory Visit | Attending: Internal Medicine | Admitting: Internal Medicine

## 2016-09-30 ENCOUNTER — Encounter (HOSPITAL_COMMUNITY): Payer: Self-pay

## 2016-10-05 ENCOUNTER — Encounter (HOSPITAL_COMMUNITY): Payer: Self-pay

## 2016-10-07 ENCOUNTER — Encounter (HOSPITAL_COMMUNITY)
Admission: RE | Admit: 2016-10-07 | Discharge: 2016-10-07 | Disposition: A | Discharge status: Home / Self Care | Payer: Self-pay | Source: Ambulatory Visit | Attending: Internal Medicine | Admitting: Internal Medicine

## 2016-10-12 ENCOUNTER — Encounter (HOSPITAL_COMMUNITY): Payer: Self-pay

## 2016-10-14 ENCOUNTER — Encounter (HOSPITAL_COMMUNITY)
Admission: RE | Admit: 2016-10-14 | Discharge: 2016-10-14 | Disposition: A | Discharge status: Home / Self Care | Payer: Self-pay | Source: Ambulatory Visit | Attending: Internal Medicine | Admitting: Internal Medicine

## 2016-10-14 DIAGNOSIS — J449 Chronic obstructive pulmonary disease, unspecified: Secondary | ICD-10-CM | POA: Insufficient documentation

## 2016-10-19 ENCOUNTER — Encounter (HOSPITAL_COMMUNITY)
Admission: RE | Admit: 2016-10-19 | Discharge: 2016-10-19 | Disposition: A | Discharge status: Home / Self Care | Payer: Self-pay | Source: Ambulatory Visit | Attending: Internal Medicine | Admitting: Internal Medicine

## 2016-10-21 ENCOUNTER — Encounter (HOSPITAL_COMMUNITY)
Admission: RE | Admit: 2016-10-21 | Discharge: 2016-10-21 | Disposition: A | Discharge status: Home / Self Care | Payer: Self-pay | Source: Ambulatory Visit | Attending: Internal Medicine | Admitting: Internal Medicine

## 2016-10-26 ENCOUNTER — Encounter (HOSPITAL_COMMUNITY)
Admission: RE | Admit: 2016-10-26 | Discharge: 2016-10-26 | Disposition: A | Discharge status: Home / Self Care | Payer: Self-pay | Source: Ambulatory Visit | Attending: Internal Medicine | Admitting: Internal Medicine

## 2016-10-28 ENCOUNTER — Encounter (HOSPITAL_COMMUNITY): Payer: Self-pay

## 2016-11-02 ENCOUNTER — Encounter (HOSPITAL_COMMUNITY)
Admission: RE | Admit: 2016-11-02 | Discharge: 2016-11-02 | Disposition: A | Discharge status: Home / Self Care | Payer: Self-pay | Source: Ambulatory Visit | Attending: Internal Medicine | Admitting: Internal Medicine

## 2016-11-04 ENCOUNTER — Encounter (HOSPITAL_COMMUNITY): Payer: Self-pay

## 2016-11-09 ENCOUNTER — Encounter (HOSPITAL_COMMUNITY)
Admission: RE | Admit: 2016-11-09 | Discharge: 2016-11-09 | Disposition: A | Discharge status: Home / Self Care | Payer: Self-pay | Source: Ambulatory Visit | Attending: Internal Medicine | Admitting: Internal Medicine

## 2016-11-11 ENCOUNTER — Encounter (HOSPITAL_COMMUNITY)
Admission: RE | Admit: 2016-11-11 | Discharge: 2016-11-11 | Disposition: A | Discharge status: Home / Self Care | Payer: Self-pay | Source: Ambulatory Visit | Attending: Internal Medicine | Admitting: Internal Medicine

## 2016-11-16 ENCOUNTER — Encounter (HOSPITAL_COMMUNITY)
Admission: RE | Admit: 2016-11-16 | Discharge: 2016-11-16 | Disposition: A | Discharge status: Home / Self Care | Payer: Self-pay | Source: Ambulatory Visit | Attending: Internal Medicine | Admitting: Internal Medicine

## 2016-11-16 DIAGNOSIS — J449 Chronic obstructive pulmonary disease, unspecified: Secondary | ICD-10-CM | POA: Insufficient documentation

## 2016-11-18 ENCOUNTER — Encounter (HOSPITAL_COMMUNITY)
Admission: RE | Admit: 2016-11-18 | Discharge: 2016-11-18 | Disposition: A | Discharge status: Home / Self Care | Payer: Self-pay | Source: Ambulatory Visit | Attending: Internal Medicine | Admitting: Internal Medicine

## 2016-11-23 ENCOUNTER — Encounter (HOSPITAL_COMMUNITY): Payer: Self-pay

## 2016-11-25 ENCOUNTER — Encounter (HOSPITAL_COMMUNITY)
Admission: RE | Admit: 2016-11-25 | Discharge: 2016-11-25 | Disposition: A | Discharge status: Home / Self Care | Payer: Self-pay | Source: Ambulatory Visit | Attending: Internal Medicine | Admitting: Internal Medicine

## 2016-11-30 ENCOUNTER — Encounter (HOSPITAL_COMMUNITY)
Admission: RE | Admit: 2016-11-30 | Discharge: 2016-11-30 | Disposition: A | Discharge status: Home / Self Care | Payer: Self-pay | Source: Ambulatory Visit | Attending: Internal Medicine | Admitting: Internal Medicine

## 2016-12-02 ENCOUNTER — Encounter (HOSPITAL_COMMUNITY): Payer: Self-pay

## 2016-12-07 ENCOUNTER — Encounter (HOSPITAL_COMMUNITY): Payer: Self-pay

## 2016-12-09 ENCOUNTER — Encounter (HOSPITAL_COMMUNITY): Payer: Self-pay

## 2016-12-14 ENCOUNTER — Encounter (HOSPITAL_COMMUNITY): Payer: Self-pay

## 2016-12-14 DIAGNOSIS — J449 Chronic obstructive pulmonary disease, unspecified: Secondary | ICD-10-CM | POA: Insufficient documentation

## 2016-12-16 ENCOUNTER — Encounter (HOSPITAL_COMMUNITY): Payer: Self-pay

## 2016-12-21 ENCOUNTER — Encounter (HOSPITAL_COMMUNITY)
Admission: RE | Admit: 2016-12-21 | Discharge: 2016-12-21 | Disposition: A | Discharge status: Home / Self Care | Payer: Self-pay | Source: Ambulatory Visit | Attending: Internal Medicine | Admitting: Internal Medicine

## 2016-12-23 ENCOUNTER — Encounter (HOSPITAL_COMMUNITY)
Admission: RE | Admit: 2016-12-23 | Discharge: 2016-12-23 | Disposition: A | Discharge status: Home / Self Care | Payer: Self-pay | Source: Ambulatory Visit | Attending: Internal Medicine | Admitting: Internal Medicine

## 2016-12-28 ENCOUNTER — Encounter (HOSPITAL_COMMUNITY): Payer: Self-pay

## 2016-12-30 ENCOUNTER — Encounter (HOSPITAL_COMMUNITY): Payer: Self-pay

## 2017-01-04 ENCOUNTER — Encounter (HOSPITAL_COMMUNITY): Payer: Self-pay

## 2017-01-06 ENCOUNTER — Encounter (HOSPITAL_COMMUNITY): Payer: Self-pay

## 2017-01-11 ENCOUNTER — Encounter (HOSPITAL_COMMUNITY): Payer: Self-pay

## 2017-01-13 ENCOUNTER — Encounter (HOSPITAL_COMMUNITY)
Admission: RE | Admit: 2017-01-13 | Discharge: 2017-01-13 | Disposition: A | Discharge status: Home / Self Care | Payer: Medicare Other | Source: Ambulatory Visit | Attending: Internal Medicine | Admitting: Internal Medicine

## 2017-01-13 DIAGNOSIS — J449 Chronic obstructive pulmonary disease, unspecified: Secondary | ICD-10-CM | POA: Insufficient documentation

## 2017-01-18 ENCOUNTER — Encounter (HOSPITAL_COMMUNITY): Payer: Medicare Other

## 2017-01-20 ENCOUNTER — Encounter (HOSPITAL_COMMUNITY): Payer: Medicare Other

## 2017-01-25 ENCOUNTER — Encounter (HOSPITAL_COMMUNITY): Payer: Medicare Other

## 2017-01-27 ENCOUNTER — Encounter (HOSPITAL_COMMUNITY): Payer: Medicare Other

## 2017-02-01 ENCOUNTER — Encounter (HOSPITAL_COMMUNITY): Payer: Medicare Other

## 2017-02-03 ENCOUNTER — Encounter (HOSPITAL_COMMUNITY): Payer: Medicare Other

## 2017-02-08 ENCOUNTER — Encounter (HOSPITAL_COMMUNITY): Payer: Medicare Other

## 2017-02-10 ENCOUNTER — Encounter (HOSPITAL_COMMUNITY): Payer: Medicare Other

## 2017-02-15 ENCOUNTER — Encounter (HOSPITAL_COMMUNITY): Payer: Medicare Other

## 2017-02-17 ENCOUNTER — Encounter (HOSPITAL_COMMUNITY): Payer: Medicare Other

## 2017-02-22 ENCOUNTER — Encounter (HOSPITAL_COMMUNITY): Payer: Medicare Other

## 2017-02-24 ENCOUNTER — Encounter (HOSPITAL_COMMUNITY): Payer: Medicare Other

## 2017-03-01 ENCOUNTER — Encounter (HOSPITAL_COMMUNITY): Payer: Medicare Other

## 2017-03-03 ENCOUNTER — Encounter (HOSPITAL_COMMUNITY): Payer: Medicare Other

## 2017-03-08 ENCOUNTER — Encounter (HOSPITAL_COMMUNITY): Payer: Medicare Other

## 2017-03-10 ENCOUNTER — Encounter (HOSPITAL_COMMUNITY): Payer: Medicare Other

## 2017-03-15 ENCOUNTER — Encounter (HOSPITAL_COMMUNITY): Payer: Medicare Other

## 2017-03-17 ENCOUNTER — Encounter (HOSPITAL_COMMUNITY): Payer: Medicare Other

## 2017-03-22 ENCOUNTER — Encounter (HOSPITAL_COMMUNITY): Payer: Medicare Other

## 2017-03-24 ENCOUNTER — Encounter (HOSPITAL_COMMUNITY): Payer: Medicare Other

## 2018-12-29 ENCOUNTER — Telehealth (HOSPITAL_COMMUNITY): Payer: Self-pay | Admitting: Psychiatry
